# Patient Record
Sex: Female | Born: 1975 | Race: White | Hispanic: No | Marital: Married | Smoking: Never smoker
Health system: Southern US, Community
[De-identification: ages and names within clinical notes are randomized; demographics above are authoritative.]

## PROBLEM LIST (undated history)

## (undated) DIAGNOSIS — J302 Other seasonal allergic rhinitis: Secondary | ICD-10-CM

## (undated) DIAGNOSIS — K429 Umbilical hernia without obstruction or gangrene: Secondary | ICD-10-CM

## (undated) DIAGNOSIS — F329 Major depressive disorder, single episode, unspecified: Secondary | ICD-10-CM

## (undated) DIAGNOSIS — F32A Depression, unspecified: Secondary | ICD-10-CM

## (undated) DIAGNOSIS — N920 Excessive and frequent menstruation with regular cycle: Secondary | ICD-10-CM

## (undated) HISTORY — DX: Umbilical hernia without obstruction or gangrene: K42.9

## (undated) HISTORY — DX: Major depressive disorder, single episode, unspecified: F32.9

## (undated) HISTORY — DX: Other seasonal allergic rhinitis: J30.2

## (undated) HISTORY — DX: Depression, unspecified: F32.A

---

## 2010-04-13 HISTORY — PX: DILATION AND CURETTAGE OF UTERUS: SHX78

## 2013-03-22 DIAGNOSIS — O24419 Gestational diabetes mellitus in pregnancy, unspecified control: Secondary | ICD-10-CM | POA: Insufficient documentation

## 2013-03-22 DIAGNOSIS — F329 Major depressive disorder, single episode, unspecified: Secondary | ICD-10-CM | POA: Insufficient documentation

## 2013-03-22 DIAGNOSIS — O0990 Supervision of high risk pregnancy, unspecified, unspecified trimester: Secondary | ICD-10-CM | POA: Insufficient documentation

## 2013-03-22 DIAGNOSIS — F32A Depression, unspecified: Secondary | ICD-10-CM | POA: Insufficient documentation

## 2013-11-27 DIAGNOSIS — Z8632 Personal history of gestational diabetes: Secondary | ICD-10-CM | POA: Insufficient documentation

## 2013-11-27 DIAGNOSIS — E785 Hyperlipidemia, unspecified: Secondary | ICD-10-CM | POA: Insufficient documentation

## 2014-03-27 ENCOUNTER — Emergency Department: Payer: Self-pay | Admitting: Emergency Medicine

## 2014-03-27 LAB — URINALYSIS, COMPLETE
Bacteria: NONE SEEN
Bilirubin,UR: NEGATIVE
Blood: NEGATIVE
GLUCOSE, UR: NEGATIVE mg/dL (ref 0–75)
KETONE: NEGATIVE
LEUKOCYTE ESTERASE: NEGATIVE
Nitrite: NEGATIVE
Ph: 8 (ref 4.5–8.0)
Protein: NEGATIVE
RBC, UR: NONE SEEN /HPF (ref 0–5)
SQUAMOUS EPITHELIAL: NONE SEEN
Specific Gravity: 1.005 (ref 1.003–1.030)

## 2014-03-27 LAB — CBC WITH DIFFERENTIAL/PLATELET
BASOS ABS: 0.1 10*3/uL (ref 0.0–0.1)
Basophil %: 0.9 %
EOS PCT: 2.8 %
Eosinophil #: 0.2 10*3/uL (ref 0.0–0.7)
HCT: 45.1 % (ref 35.0–47.0)
HGB: 14.6 g/dL (ref 12.0–16.0)
Lymphocyte #: 1.6 10*3/uL (ref 1.0–3.6)
Lymphocyte %: 27.1 %
MCH: 27.6 pg (ref 26.0–34.0)
MCHC: 32.4 g/dL (ref 32.0–36.0)
MCV: 85 fL (ref 80–100)
MONO ABS: 0.4 x10 3/mm (ref 0.2–0.9)
Monocyte %: 7.4 %
NEUTROS ABS: 3.7 10*3/uL (ref 1.4–6.5)
Neutrophil %: 61.8 %
PLATELETS: 334 10*3/uL (ref 150–440)
RBC: 5.29 10*6/uL — AB (ref 3.80–5.20)
RDW: 12.6 % (ref 11.5–14.5)
WBC: 6 10*3/uL (ref 3.6–11.0)

## 2014-03-27 LAB — COMPREHENSIVE METABOLIC PANEL
ALBUMIN: 4.2 g/dL (ref 3.4–5.0)
ALT: 30 U/L
Alkaline Phosphatase: 144 U/L — ABNORMAL HIGH
Anion Gap: 6 — ABNORMAL LOW (ref 7–16)
BILIRUBIN TOTAL: 0.6 mg/dL (ref 0.2–1.0)
BUN: 13 mg/dL (ref 7–18)
CALCIUM: 8.6 mg/dL (ref 8.5–10.1)
CHLORIDE: 104 mmol/L (ref 98–107)
Co2: 28 mmol/L (ref 21–32)
Creatinine: 0.71 mg/dL (ref 0.60–1.30)
GLUCOSE: 96 mg/dL (ref 65–99)
Osmolality: 276 (ref 275–301)
POTASSIUM: 3.5 mmol/L (ref 3.5–5.1)
SGOT(AST): 20 U/L (ref 15–37)
Sodium: 138 mmol/L (ref 136–145)
Total Protein: 7.5 g/dL (ref 6.4–8.2)

## 2014-03-27 LAB — LIPASE, BLOOD: LIPASE: 208 U/L (ref 73–393)

## 2015-05-15 ENCOUNTER — Emergency Department
Admission: EM | Admit: 2015-05-15 | Discharge: 2015-05-15 | Disposition: A | Discharge status: Home / Self Care | Payer: Managed Care, Other (non HMO) | Attending: Emergency Medicine | Admitting: Emergency Medicine

## 2015-05-15 ENCOUNTER — Emergency Department: Payer: Managed Care, Other (non HMO)

## 2015-05-15 ENCOUNTER — Encounter: Payer: Self-pay | Admitting: Emergency Medicine

## 2015-05-15 ENCOUNTER — Other Ambulatory Visit: Payer: Self-pay

## 2015-05-15 DIAGNOSIS — Z3202 Encounter for pregnancy test, result negative: Secondary | ICD-10-CM | POA: Insufficient documentation

## 2015-05-15 DIAGNOSIS — K429 Umbilical hernia without obstruction or gangrene: Secondary | ICD-10-CM | POA: Insufficient documentation

## 2015-05-15 DIAGNOSIS — R1033 Periumbilical pain: Secondary | ICD-10-CM | POA: Diagnosis present

## 2015-05-15 LAB — COMPREHENSIVE METABOLIC PANEL
ALT: 47 U/L (ref 14–54)
AST: 25 U/L (ref 15–41)
Albumin: 4.4 g/dL (ref 3.5–5.0)
Alkaline Phosphatase: 103 U/L (ref 38–126)
Anion gap: 5 (ref 5–15)
BILIRUBIN TOTAL: 0.9 mg/dL (ref 0.3–1.2)
BUN: 10 mg/dL (ref 6–20)
CHLORIDE: 109 mmol/L (ref 101–111)
CO2: 25 mmol/L (ref 22–32)
CREATININE: 0.58 mg/dL (ref 0.44–1.00)
Calcium: 9.3 mg/dL (ref 8.9–10.3)
GFR calc Af Amer: >60 mL/min (ref >60)
GLUCOSE: 91 mg/dL (ref 65–99)
Potassium: 3.6 mmol/L (ref 3.5–5.1)
Sodium: 139 mmol/L (ref 135–145)
Total Protein: 7 g/dL (ref 6.5–8.1)

## 2015-05-15 LAB — URINALYSIS COMPLETE WITH MICROSCOPIC (ARMC ONLY)
BILIRUBIN URINE: NEGATIVE
GLUCOSE, UA: NEGATIVE mg/dL
Hgb urine dipstick: NEGATIVE
KETONES UR: NEGATIVE mg/dL
LEUKOCYTES UA: NEGATIVE
Nitrite: NEGATIVE
PH: 6 (ref 5.0–8.0)
Protein, ur: NEGATIVE mg/dL
SQUAMOUS EPITHELIAL / LPF: NONE SEEN
Specific Gravity, Urine: 1.003 — ABNORMAL LOW (ref 1.005–1.030)
WBC, UA: NONE SEEN WBC/hpf (ref 0–5)

## 2015-05-15 LAB — CBC
HCT: 40.6 % (ref 35.0–47.0)
Hemoglobin: 13.6 g/dL (ref 12.0–16.0)
MCH: 27.8 pg (ref 26.0–34.0)
MCHC: 33.5 g/dL (ref 32.0–36.0)
MCV: 82.9 fL (ref 80.0–100.0)
PLATELETS: 234 10*3/uL (ref 150–440)
RBC: 4.89 MIL/uL (ref 3.80–5.20)
RDW: 12.7 % (ref 11.5–14.5)
WBC: 5.2 10*3/uL (ref 3.6–11.0)

## 2015-05-15 LAB — POCT PREGNANCY, URINE: Preg Test, Ur: NEGATIVE

## 2015-05-15 LAB — LIPASE, BLOOD: Lipase: 41 U/L (ref 11–51)

## 2015-05-15 MED ORDER — SODIUM CHLORIDE 0.9 % IV BOLUS (SEPSIS)
1000.0000 mL | Freq: Once | INTRAVENOUS | Status: AC
  Administered 2015-05-15: 1000 mL via INTRAVENOUS

## 2015-05-15 MED ORDER — IOHEXOL 240 MG/ML SOLN
25.0000 mL | Freq: Once | INTRAMUSCULAR | Status: AC | PRN
  Administered 2015-05-15: 25 mL via ORAL
  Filled 2015-05-15: qty 25

## 2015-05-15 MED ORDER — IBUPROFEN 800 MG PO TABS: 800.0000 mg | ORAL_TABLET | Freq: Three times a day (TID) | ORAL | Status: AC | PRN

## 2015-05-15 MED ORDER — IOHEXOL 300 MG/ML  SOLN
100.0000 mL | Freq: Once | INTRAMUSCULAR | Status: AC | PRN
  Administered 2015-05-15: 100 mL via INTRAVENOUS
  Filled 2015-05-15: qty 100

## 2015-05-15 MED ORDER — ONDANSETRON HCL 4 MG/2ML IJ SOLN
4.0000 mg | Freq: Once | INTRAMUSCULAR | Status: AC
  Administered 2015-05-15: 4 mg via INTRAVENOUS
  Filled 2015-05-15: qty 2

## 2015-05-15 MED ORDER — OXYCODONE-ACETAMINOPHEN 5-325 MG PO TABS: 1.0000 | ORAL_TABLET | Freq: Four times a day (QID) | ORAL | Status: DC | PRN

## 2015-05-15 MED ORDER — HYDROMORPHONE HCL 1 MG/ML IJ SOLN
0.5000 mg | Freq: Once | INTRAMUSCULAR | Status: DC
  Filled 2015-05-15: qty 1

## 2015-05-15 NOTE — ED Notes (Signed)
Pt to ed with c/o abd pain in and around umbilicus.  Pt states areas around umbilicus is tender to palpation.  Pt reports any pressure on abd causes severe pain and nausea.

## 2015-05-15 NOTE — Discharge Instructions (Signed)
Please make a follow-up appointment for evaluation for surgical intervention for your hernia. Return to the emergency department if you develop severe pain, inability to keep down fluids, fever, or any other symptoms concerning to you. You may take Tylenol or Motrin for mild to moderate pain and Percocet for severe pain. Do not drive within 8 hours of taking Percocet.  Hernia, Adult A hernia is the bulging of an organ or tissue through a weak spot in the muscles of the abdomen (abdominal wall). Hernias develop most often near the navel or groin. There are many kinds of hernias. Common kinds include:  Femoral hernia. This kind of hernia develops under the groin in the upper thigh area.  Inguinal hernia. This kind of hernia develops in the groin or scrotum.  Umbilical hernia. This kind of hernia develops near the navel.  Hiatal hernia. This kind of hernia causes part of the stomach to be pushed up into the chest.  Incisional hernia. This kind of hernia bulges through a scar from an abdominal surgery. CAUSES This condition may be caused by:  Heavy lifting.  Coughing over a long period of time.  Straining to have a bowel movement.  An incision made during an abdominal surgery.  A birth defect (congenital defect).  Excess weight or obesity.  Smoking.  Poor nutrition.  Cystic fibrosis.  Excess fluid in the abdomen.  Undescended testicles. SYMPTOMS Symptoms of a hernia include:  A lump on the abdomen. This is the first sign of a hernia. The lump may become more obvious with standing, straining, or coughing. It may get bigger over time if it is not treated or if the condition causing it is not treated.  Pain. A hernia is usually painless, but it may become painful over time if treatment is delayed. The pain is usually dull and may get worse with standing or lifting heavy objects. Sometimes a hernia gets tightly squeezed in the weak spot (strangulated) or stuck there  (incarcerated) and causes additional symptoms. These symptoms may include:  Vomiting.  Nausea.  Constipation.  Irritability. DIAGNOSIS A hernia may be diagnosed with:  A physical exam. During the exam your health care provider may ask you to cough or to make a specific movement, because a hernia is usually more visible when you move.  Imaging tests. These can include:  X-rays.  Ultrasound.  CT scan. TREATMENT A hernia that is small and painless may not need to be treated. A hernia that is large or painful may be treated with surgery. Inguinal hernias may be treated with surgery to prevent incarceration or strangulation. Strangulated hernias are always treated with surgery, because lack of blood to the trapped organ or tissue can cause it to die. Surgery to treat a hernia involves pushing the bulge back into place and repairing the weak part of the abdomen. HOME CARE INSTRUCTIONS  Avoid straining.  Do not lift anything heavier than 10 lb (4.5 kg).  Lift with your leg muscles, not your back muscles. This helps avoid strain.  When coughing, try to cough gently.  Prevent constipation. Constipation leads to straining with bowel movements, which can make a hernia worse or cause a hernia repair to break down. You can prevent constipation by:  Eating a high-fiber diet that includes plenty of fruits and vegetables.  Drinking enough fluids to keep your urine clear or pale yellow. Aim to drink 6-8 glasses of water per day.  Using a stool softener as directed by your health care provider.  Lose weight, if you are overweight.  Do not use any tobacco products, including cigarettes, chewing tobacco, or electronic cigarettes. If you need help quitting, ask your health care provider.  Keep all follow-up visits as directed by your health care provider. This is important. Your health care provider may need to monitor your condition. SEEK MEDICAL CARE IF:  You have swelling, redness, and  pain in the affected area.  Your bowel habits change. SEEK IMMEDIATE MEDICAL CARE IF:  You have a fever.  You have abdominal pain that is getting worse.  You feel nauseous or you vomit.  You cannot push the hernia back in place by gently pressing on it while you are lying down.  The hernia:  Changes in shape or size.  Is stuck outside the abdomen.  Becomes discolored.  Feels hard or tender.   This information is not intended to replace advice given to you by your health care provider. Make sure you discuss any questions you have with your health care provider.   Document Released: 03/30/2005 Document Revised: 04/20/2014 Document Reviewed: 02/07/2014 Elsevier Interactive Patient Education [employer]

## 2015-05-15 NOTE — ED Notes (Signed)
NAD noted at time of D/C. Pt denies questions or concerns. Pt ambulatory to the lobby at this time.  

## 2015-05-15 NOTE — ED Provider Notes (Signed)
[facility] Emergency Department Provider Note  ____________________________________________  Time seen: Approximately 4:18 PM  I have reviewed the triage vital signs and the nursing notes.   HISTORY  Chief Complaint Abdominal Pain    HPI Breanna Berg is a 40 y.o. female with a history of umbilical hernia for at least 2 years presenting with pain at the umbilical hernia site that started today. Patient states that she woke up and was having tenderness to palpation over her umbilical hernia with some slight bluish discoloration. She also reports abdominal distention. Positive nausea without vomiting, no diarrhea, no urinary symptoms, no change in vaginal discharge. Nothing makes the pain better or worse. No fever or chills.   History reviewed. No pertinent past medical history.  There are no active problems to display for this patient.   History reviewed. No pertinent past surgical history.  No current outpatient prescriptions on file.  Allergies Avelox and Wellbutrin  History reviewed. No pertinent family history.  Social History Social History  Substance Use Topics  . Smoking status: Never Smoker   . Smokeless tobacco: None  . Alcohol Use: Yes    Review of Systems Constitutional: No fever/chills. No lightheadedness or syncope. Eyes: No visual changes. ENT: No sore throat. Cardiovascular: Denies chest pain, palpitations. Respiratory: Denies shortness of breath.  No cough. Gastrointestinal: Positive umbilical abdominal pain with palpable mass.  Positive nausea, no vomiting.  No diarrhea.  No constipation. Genitourinary: Negative for dysuria. No change in vaginal discharge Musculoskeletal: Negative for back pain. Skin: Negative for rash. Neurological: Negative for headaches, focal weakness or numbness.  10-point ROS otherwise negative.  ____________________________________________   PHYSICAL EXAM:  VITAL SIGNS: ED Triage Vitals  Enc  Vitals Group     BP 05/15/15 1408 119/69 mmHg     Pulse Rate 05/15/15 1408 75     Resp 05/15/15 1408 20     Temp 05/15/15 1408 98.1 F (36.7 C)     Temp Source 05/15/15 1408 Oral     SpO2 05/15/15 1408 100 %     Weight 05/15/15 1408 150 lb (68.04 kg)     Height 05/15/15 1408 5\' 2"  (1.575 m)     Head Cir --      Peak Flow --      Pain Score 05/15/15 1409 6     Pain Loc --      Pain Edu? --      Excl. in GC? --     Constitutional: Alert and oriented. Well appearing and in no acute distress. Answer question appropriately. Eyes: Conjunctivae are normal.  EOMI. no scleral icterus. Head: Atraumatic. Nose: No congestion/rhinnorhea. Mouth/Throat: Mucous membranes are moist.  Neck: No stridor.  Supple.   Cardiovascular: Normal rate, regular rhythm. No murmurs, rubs or gallops.  Respiratory: Normal respiratory effort.  No retractions. Lungs CTAB.  No wheezes, rales or ronchi. Gastrointestinal: Abdomen is overweight, soft and mildly distended. The patient has a palpable 2 x 2 cm mass at the superior aspect of the umbilicus that increases in pain with deep palpation. No rebound or guarding. No peritoneal signs.  Musculoskeletal: No LE edema.  Neurologic:  Normal speech and language. No gross focal neurologic deficits are appreciated.  Skin:  Skin is warm, dry and intact. No rash noted. Psychiatric: Mood and affect are normal. Speech and behavior are normal.  Normal judgement.  ____________________________________________   LABS (all labs ordered are listed, but only abnormal results are displayed)  Labs Reviewed  URINALYSIS COMPLETEWITH MICROSCOPIC [facility]  ONLY) - Abnormal; Notable for the following:    Color, Urine COLORLESS (*)    APPearance CLEAR (*)    Specific Gravity, Urine 1.003 (*)    Bacteria, UA RARE (*)    All other components within normal limits  LIPASE, BLOOD  COMPREHENSIVE METABOLIC PANEL  CBC  POC URINE PREG, ED  POCT PREGNANCY, URINE  POC URINE PREG, ED    ____________________________________________  EKG  ED ECG REPORT I, Rockne Menghini, the attending physician, personally viewed and interpreted this ECG.   Date: 05/15/2015  EKG Time: 1548  Rate: 60  Rhythm: normal sinus rhythm  Axis: Normal  Intervals:none  ST&T Change: Nonspecific T-wave inversion in V1. No ST elevation. No ischemic changes.  ____________________________________________  RADIOLOGY  Ct Abdomen Pelvis W Contrast  05/15/2015  CLINICAL DATA:  39 year old female complaining of pain around the umbilicus. Periumbilical region is reportedly tender to palpation. Pressure on abdomen causes severe pain and nausea. EXAM: CT ABDOMEN AND PELVIS WITH CONTRAST TECHNIQUE: Multidetector CT imaging of the abdomen and pelvis was performed using the standard protocol following bolus administration of intravenous contrast. CONTRAST:  OMNIPAQUE IOHEXOL 300 MG/ML  SOLN COMPARISON:  No priors. FINDINGS: Lower chest:  Unremarkable. Hepatobiliary: No cystic or solid hepatic lesions. No intra or extrahepatic biliary ductal dilatation. Gallbladder is normal in appearance. Pancreas: No pancreatic mass. No pancreatic ductal dilatation. No pancreatic or peripancreatic fluid or inflammatory changes. Spleen: Unremarkable. Adrenals/Urinary Tract: Bilateral adrenal glands and bilateral kidneys are normal in appearance. No hydroureteronephrosis or perinephric stranding to indicate urinary tract obstruction at this time. Urinary bladder is normal in appearance. Stomach/Bowel: Normal appearance of the stomach. No pathologic dilatation of small bowel or colon. Normal appendix. Vascular/Lymphatic: No significant atherosclerotic disease, aneurysm or dissection identified in the abdominal or pelvic vasculature. No lymphadenopathy noted in the abdomen or pelvis. Reproductive: IUD present in the uterus. Ovaries are unremarkable in appearance. Other: Small umbilical hernia containing only omental fat. No  significant volume of ascites. No pneumoperitoneum. Musculoskeletal: There are no aggressive appearing lytic or blastic lesions noted in the visualized portions of the skeleton. IMPRESSION: 1. Small umbilical hernia containing only omental fat. No associated bowel incarceration or obstruction at this time. 2. Normal appendix. 3. Additional incidental findings, as above. Electronically Signed   By: Trudie Reed M.D.   On: 05/15/2015 17:55    ____________________________________________   PROCEDURES  Procedure(s) performed: None  Critical Care performed: No ____________________________________________   INITIAL IMPRESSION / ASSESSMENT AND PLAN / ED COURSE  Pertinent labs & imaging results that were available during my care of the patient were reviewed by me and considered in my medical decision making (see chart for details).  40 y.o. female with a long history of umbilical hernia presenting with acute pain around the umbilicus. Associated nausea but no vomiting. Patient's labs are reassuring however her physical examination demonstrates tenderness around an area that likely has an underlying hernia. I will get a CT scan to evaluate for incarceration. Plan symptomatic treatment and reevaluate after her diagnostic testing is completed.  ----------------------------------------- 7:04 PM on 05/15/2015 -----------------------------------------  The patient does have an umbilical hernia with omental fat but no intestine and no evidence for incarceration on her CT. I will plan to discharge her home for evaluation by general surgery for elective repair of her hernia. She understands return precautions, as well as discharge instructions and follow-up instructions.  ____________________________________________  FINAL CLINICAL IMPRESSION(S) / ED DIAGNOSES  Final diagnoses:  Umbilical hernia without obstruction and  without gangrene      NEW MEDICATIONS STARTED DURING THIS VISIT:  New  Prescriptions   No medications on file     Rockne Menghini, MD 05/15/15 1905

## 2015-05-21 ENCOUNTER — Encounter: Payer: Self-pay | Admitting: Surgery

## 2015-05-22 ENCOUNTER — Encounter: Payer: Self-pay | Admitting: General Surgery

## 2015-05-22 ENCOUNTER — Ambulatory Visit (INDEPENDENT_AMBULATORY_CARE_PROVIDER_SITE_OTHER): Payer: Managed Care, Other (non HMO) | Admitting: General Surgery

## 2015-05-22 VITALS — BP 108/75 | HR 87 | Temp 97.9°F | Ht 62.0 in | Wt 154.0 lb

## 2015-05-22 DIAGNOSIS — K429 Umbilical hernia without obstruction or gangrene: Secondary | ICD-10-CM | POA: Diagnosis not present

## 2015-05-22 NOTE — Progress Notes (Signed)
Patient ID: Breanna Berg, female   DOB: [birth year], 40 y.o.   MRN: [id number]  CC: UMBILICAL HERNIA  HPI Breanna Berg is a 40 y.o. female  Presents to clinic today for evaluation of umbilical hernia. Patient thinks it might even present since birth of her child 2 years ago. However, it only became symptomatic over the last couple of weeks after having a GI bug with persistent nausea and vomiting. Last week she had an episode of abdominal pain at the site of her hernia, abdominal distention, nausea and vomiting approximately to the emergency department. While the emergency department showed a CT scan which showed umbilical hernia that was fat containing and her symptoms resolved. Since that time she is continued to have intermittent pain prompting her to seek follow-up with surgery for surgical resolution. She denies any current fevers, chills, nausea, vomiting, chest pain, shortness breath, diarrhea, constipation. She is otherwise in her usual state of health.  HPI  Past Medical History  Diagnosis Date  . Hernia, umbilical   . Seasonal allergies     Past Surgical History  Procedure Laterality Date  . Dilation and curettage of uterus  04/2010    Family History  Problem Relation Age of Onset  . Diabetes Father     Social History Social History  Substance Use Topics  . Smoking status: Never Smoker   . Smokeless tobacco: None  . Alcohol Use: 0.0 oz/week    0 Standard drinks or equivalent per week     Comment: ocass    Allergies  Allergen Reactions  . Avelox [Moxifloxacin] Nausea And Vomiting  . Wellbutrin [Bupropion] Rash    Current Outpatient Prescriptions  Medication Sig Dispense Refill  . FLUoxetine (PROZAC) 20 MG capsule Take 1 capsule by mouth 1 day or 1 dose.    . fluticasone (FLONASE) 50 MCG/ACT nasal spray Place 2 sprays into the nose 1 day or 1 dose.    . ibuprofen (ADVIL,MOTRIN) 800 MG tablet Take 1 tablet (800 mg total) by mouth every 8 (eight) hours as needed. 20  tablet 0  . loratadine (CLARITIN) 10 MG tablet Take 1 tablet by mouth as needed.    . montelukast (SINGULAIR) 10 MG tablet Take 1 tablet by mouth 1 day or 1 dose.     No current facility-administered medications for this visit.     Review of Systems A  Multi-point review of systems was asked and was negative except for the findings documented in the history of present illness  Physical Exam Blood pressure 108/75, pulse 87, temperature 97.9 F (36.6 C), temperature source Oral, height  (1.575 m), weight 69.854 kg (154 lb), last menstrual period 05/11/2015. CONSTITUTIONAL:  No acute distress. EYES: Pupils are equal, round, and reactive to light, Sclera are non-icteric. EARS, NOSE, MOUTH AND THROAT: The oropharynx is clear. The oral mucosa is pink and moist. Hearing is intact to voice. LYMPH NODES:  Lymph nodes in the neck are normal. RESPIRATORY:  Lungs are clear. There is normal respiratory effort, with equal breath sounds bilaterally, and without pathologic use of accessory muscles. CARDIOVASCULAR: Heart is regular without murmurs, gallops, or rubs. GI: The abdomen is soft,  With minimal tenderness at the site of her easily reducible umbilical hernia, and nondistended. There are no palpable masses. There is no hepatosplenomegaly. There are normal bowel sounds in all quadrants. GU: Rectal deferred.   MUSCULOSKELETAL: Normal muscle strength and tone. No cyanosis or edema.   SKIN: Turgor is good and there are no pathologic  skin lesions or ulcers. NEUROLOGIC: Motor and sensation is grossly normal. Cranial nerves are grossly intact. PSYCH:  Oriented to person, place and time. Affect is normal.  Data Reviewed  her CT scan from last week was personally reviewed. It does show approximately 1 cm fascial defect that is containing omentum. There is no evidence of bowel involvement in her hernia on her images. I have personally reviewed the patient's imaging, laboratory findings and medical  records.    Assessment     40 year old female with asymptomatic umbilical hernia     Plan     otherwise healthy 40 year old female with an umbilical hernia. Discussed treatment options to include open versus laparoscopic hernia repairs. The procedures of both were discussed in detail to include the risks, benefits, alternatives. Patient voiced understanding and would like to proceed with an open umbilical hernia repair. Since the hernia is able to be reduced with ease discussed that this could be put off to a convenient time for her. Her mother-in-law is moving to town next week and she desires to wait until the end of the month for childcare reasons if possible. Discussed with patient the signs and symptoms of incarceration or strangulation and to return to the emergency department immediately should they occur otherwise we will plan for elective open repair on February 27 the surgery center.      Time spent with the patient was 45 minutes, with more than 50% of the time spent in face-to-face education, counseling and care coordination.     [name], MD FACS General Surgeon 05/22/2015, 4:19 PM

## 2015-05-22 NOTE — Patient Instructions (Addendum)
We are able to do your surgery on 06/10/2015. This will be at [facility] and they will be contacting you with pre-op discussion and surgery arrival time. Address: [address], [place], [place] [address]  [phone].

## 2015-05-23 ENCOUNTER — Telehealth: Payer: Self-pay | Admitting: General Surgery

## 2015-05-23 NOTE — Telephone Encounter (Signed)
I have called the patient to advise her of pre op date/time and sx date. No answer. I have left a message.  Sx: 06/10/15 with Dr Tonita Cong [facility]--Laparoscopic umbilical hernia repair. Pre op: [facility] will call patient prior to surgery to do a phone pre op. Also [facility] will also call patient the day before to advise the patient of the time to arrive for surgery.

## 2015-05-24 ENCOUNTER — Ambulatory Visit: Payer: Self-pay | Admitting: Surgery

## 2015-05-24 NOTE — Telephone Encounter (Signed)
Patient has called back and was informed of all surgery information. °

## 2015-05-30 ENCOUNTER — Telehealth: Payer: Self-pay | Admitting: General Surgery

## 2015-05-30 NOTE — Telephone Encounter (Signed)
Called patient to let her know what's going on. I told her that we were able to schedule surgery on 06/27/2015 with Dr. Elesa Massed and Dr. Tonita Cong. Patient stated that she will be out of town that weekend. She was asking if she could have surgery scheduled the first week of March. I told her that Dr. Elesa Massed was not able to until March 16 th. She then stated that she would call Dr. Elesa Massed personally (cell phone since they are friends) and ask when she could do her surgery. Patient stated that she will call me to let me know when Dr. Elesa Massed would be able to do her surgery so we could correlate with Dr. Tonita Cong. I told her that it would be fine.

## 2015-05-30 NOTE — Telephone Encounter (Signed)
Dr. Elesa Massed called and wanted to know when we were able to do patient's surgery. She stated that patient wants her surgery to be done on March 2nd and she is okay if another surgeon does her hernia repair. I told Dr. Elesa Massed that I would have to ask Dr. Tonita Cong tomorrow morning so he could ask Dr. Everlene Farrier if he could do this surgery for him. Once I get an answer from Dr. Tonita Cong I would call Harriett Sine to let her know that March 2nd would be a great day. Dr. Elesa Massed agreed.

## 2015-05-30 NOTE — Telephone Encounter (Signed)
Patient is scheduled for hernia surgery with [facility] on 2/27. Patient called today to advise that she would like to have hernia surgery and tubal ligation surgery done at the same time. Patient states she went to [employer] and saw [facility] and Ms Ward agreed to this. Patient states Dr Tonita Cong needs to speak with Ms Ward to discuss a date that will work for both of them to perform both surgeries at the same time.  Patient wanted you to know she is currently taking Amoxicillin for a sinus infection in case that has any effect on scheduling her surgery.  Patient states if it is going to be a long time before she can have these 2 surgeries done together, then she would like to keep her February 27th and just have her hernia surgery and have the tubal done at a later time  . Please call at your convenience.

## 2015-05-30 NOTE — Telephone Encounter (Signed)
Called [facility] and spoke with Harriett Sine (Referral Coordinator). The reason I called them was to ask when we could schedule surgery for patient. Harriett Sine told me that she was able to schedule patient's surgery on 06/27/2015 at [facility] with Dr. Elesa Massed. I told Harriett Sine that I would tell Dr. Tonita Cong when Dr. Elesa Massed was able to do surgery with him. I told Harriett Sine that I would call patient to notify what is going on right now.

## 2015-06-03 NOTE — Telephone Encounter (Signed)
After reviewing patient's situation with Dr. Tonita Cong, he stated that he would do her surgery on June 13, 2015-first case. I then told Dr. Elesa Massed, Harriett Sine and [facility] and everybody is on aboard to proceed surgery on 06/13/2015.

## 2015-06-05 ENCOUNTER — Telehealth: Payer: Self-pay

## 2015-06-05 ENCOUNTER — Encounter
Admission: RE | Admit: 2015-06-05 | Discharge: 2015-06-05 | Disposition: A | Discharge status: Home / Self Care | Payer: Managed Care, Other (non HMO) | Source: Ambulatory Visit | Attending: General Surgery | Admitting: General Surgery

## 2015-06-05 DIAGNOSIS — Z01812 Encounter for preprocedural laboratory examination: Secondary | ICD-10-CM | POA: Diagnosis not present

## 2015-06-05 HISTORY — DX: Excessive and frequent menstruation with regular cycle: N92.0

## 2015-06-05 LAB — CBC
HEMATOCRIT: 41.3 % (ref 35.0–47.0)
HEMOGLOBIN: 13.9 g/dL (ref 12.0–16.0)
MCH: 28.1 pg (ref 26.0–34.0)
MCHC: 33.6 g/dL (ref 32.0–36.0)
MCV: 83.6 fL (ref 80.0–100.0)
Platelets: 255 10*3/uL (ref 150–440)
RBC: 4.94 MIL/uL (ref 3.80–5.20)
RDW: 13.1 % (ref 11.5–14.5)
WBC: 5.6 10*3/uL (ref 3.6–11.0)

## 2015-06-05 LAB — BASIC METABOLIC PANEL
ANION GAP: 8 (ref 5–15)
BUN: 10 mg/dL (ref 6–20)
CALCIUM: 9.1 mg/dL (ref 8.9–10.3)
CHLORIDE: 107 mmol/L (ref 101–111)
CO2: 28 mmol/L (ref 22–32)
Creatinine, Ser: 0.71 mg/dL (ref 0.44–1.00)
GFR calc non Af Amer: >60 mL/min (ref >60)
Glucose, Bld: 128 mg/dL — ABNORMAL HIGH (ref 65–99)
Potassium: 3.1 mmol/L — ABNORMAL LOW (ref 3.5–5.1)
SODIUM: 143 mmol/L (ref 135–145)

## 2015-06-05 LAB — TYPE AND SCREEN
ABO/RH(D): O POS
ANTIBODY SCREEN: NEGATIVE

## 2015-06-05 MED ORDER — POTASSIUM CHLORIDE CRYS ER 20 MEQ PO TBCR: 20.0000 meq | EXTENDED_RELEASE_TABLET | Freq: Two times a day (BID) | ORAL | Status: AC

## 2015-06-05 NOTE — Telephone Encounter (Signed)
Notified by Pre-admit that Pre-op Potassium is 3.1. Patient will be placed on K-Dur BID x 5 days prior to surgery. Patient will take this 06/08/15-06/12/15. Medication has been sent to pharmacy at this time.

## 2015-06-05 NOTE — Patient Instructions (Signed)
  Your procedure is scheduled on: 06/13/15 Thurs Report to Day Surgery.2nd floor medical mall To find out your arrival time please call [phone] between 1PM - 3PM on 06/12/15 Wed  Remember: Instructions that are not followed completely may result in serious medical risk, up to and including death, or upon the discretion of your surgeon and anesthesiologist your surgery may need to be rescheduled.    __x__ 1. Do not eat food or drink liquids after midnight. No gum chewing or hard candies.     __x__ 2. No Alcohol for 24 hours before or after surgery.   ____ 3. Bring all medications with you on the day of surgery if instructed.    __x__ 4. Notify your doctor if there is any change in your medical condition     (cold, fever, infections).     Do not wear jewelry, make-up, hairpins, clips or nail polish.  Do not wear lotions, powders, or perfumes. You may wear deodorant.  Do not shave 48 hours prior to surgery. Men may shave face and neck.  Do not bring valuables to the hospital.    [facility] is not responsible for any belongings or valuables.               Contacts, dentures or bridgework may not be worn into surgery.  Leave your suitcase in the car. After surgery it may be brought to your room.  For patients admitted to the hospital, discharge time is determined by your                treatment team.   Patients discharged the day of surgery will not be allowed to drive home.   Please read over the following fact sheets that you were given:      _x___ Take these medicines the morning of surgery with A SIP OF WATER:    1. FLUoxetine (PROZAC) 20 MG capsule  2. oxyCODONE-acetaminophen (PERCOCET/ROXICET) 5-325 MG tablet  3.   4.  5.  6.  ____ Fleet Enema (as directed)   _x___ Use CHG Soap as directed  ____ Use inhalers on the day of surgery  ____ Stop metformin 2 days prior to surgery    ____ Take 1/2 of usual insulin dose the night before surgery and none on the morning of  surgery.   ____ Stop Coumadin/Plavix/aspirin on   __x__ Stop Anti-inflammatories on Stop ibuprofen tomorrow 06/06/15   ____ Stop supplements until after surgery.    ____ Bring C-Pap to the hospital.

## 2015-06-05 NOTE — Pre-Procedure Instructions (Signed)
Potassium 3.1 called and faxed Dr Tonita Cong with lab results and request for potassium supplement.

## 2015-06-05 NOTE — Telephone Encounter (Signed)
Called patient to let her know that her Potassium level was low and therefore she will need to start taking Potassium five days prior to her surgery. I told patient that her prescription was sent to [facility]. Patient understood and had no further questions.

## 2015-06-06 LAB — ABO/RH: ABO/RH(D): O POS

## 2015-06-10 ENCOUNTER — Encounter: Admission: RE | Payer: Self-pay | Source: Ambulatory Visit

## 2015-06-10 ENCOUNTER — Ambulatory Visit
Admission: RE | Admit: 2015-06-10 | Payer: Managed Care, Other (non HMO) | Source: Ambulatory Visit | Admitting: General Surgery

## 2015-06-10 SURGERY — REPAIR, HERNIA, UMBILICAL, ADULT
Anesthesia: General

## 2015-06-13 ENCOUNTER — Encounter
Admission: RE | Disposition: A | Discharge status: Home / Self Care | Payer: Self-pay | Source: Ambulatory Visit | Attending: General Surgery

## 2015-06-13 ENCOUNTER — Encounter: Payer: Self-pay | Admitting: *Deleted

## 2015-06-13 ENCOUNTER — Ambulatory Visit: Payer: Managed Care, Other (non HMO) | Admitting: Anesthesiology

## 2015-06-13 ENCOUNTER — Ambulatory Visit
Admission: RE | Admit: 2015-06-13 | Discharge: 2015-06-13 | Disposition: A | Discharge status: Home / Self Care | Payer: Managed Care, Other (non HMO) | Source: Ambulatory Visit | Attending: General Surgery | Admitting: General Surgery

## 2015-06-13 DIAGNOSIS — F329 Major depressive disorder, single episode, unspecified: Secondary | ICD-10-CM | POA: Insufficient documentation

## 2015-06-13 DIAGNOSIS — K429 Umbilical hernia without obstruction or gangrene: Secondary | ICD-10-CM | POA: Diagnosis not present

## 2015-06-13 DIAGNOSIS — Z825 Family history of asthma and other chronic lower respiratory diseases: Secondary | ICD-10-CM | POA: Insufficient documentation

## 2015-06-13 DIAGNOSIS — R140 Abdominal distension (gaseous): Secondary | ICD-10-CM | POA: Insufficient documentation

## 2015-06-13 DIAGNOSIS — N920 Excessive and frequent menstruation with regular cycle: Secondary | ICD-10-CM | POA: Diagnosis not present

## 2015-06-13 DIAGNOSIS — Z91048 Other nonmedicinal substance allergy status: Secondary | ICD-10-CM | POA: Insufficient documentation

## 2015-06-13 DIAGNOSIS — Z79899 Other long term (current) drug therapy: Secondary | ICD-10-CM | POA: Diagnosis not present

## 2015-06-13 DIAGNOSIS — R109 Unspecified abdominal pain: Secondary | ICD-10-CM | POA: Insufficient documentation

## 2015-06-13 DIAGNOSIS — Z8042 Family history of malignant neoplasm of prostate: Secondary | ICD-10-CM | POA: Diagnosis not present

## 2015-06-13 DIAGNOSIS — Z833 Family history of diabetes mellitus: Secondary | ICD-10-CM | POA: Insufficient documentation

## 2015-06-13 DIAGNOSIS — Z888 Allergy status to other drugs, medicaments and biological substances status: Secondary | ICD-10-CM | POA: Insufficient documentation

## 2015-06-13 DIAGNOSIS — Z302 Encounter for sterilization: Secondary | ICD-10-CM | POA: Insufficient documentation

## 2015-06-13 HISTORY — PX: LAPAROSCOPIC BILATERAL SALPINGECTOMY: SHX5889

## 2015-06-13 HISTORY — PX: LAPAROSCOPIC TUBAL LIGATION: SHX1937

## 2015-06-13 HISTORY — PX: NOVASURE ABLATION: SHX5394

## 2015-06-13 HISTORY — PX: UMBILICAL HERNIA REPAIR: SHX196

## 2015-06-13 LAB — POCT PREGNANCY, URINE: Preg Test, Ur: NEGATIVE

## 2015-06-13 LAB — POCT I-STAT 4, (NA,K, GLUC, HGB,HCT): Potassium: 3.5 mmol/L

## 2015-06-13 SURGERY — REPAIR, HERNIA, UMBILICAL, LAPAROSCOPIC
Anesthesia: General

## 2015-06-13 MED ORDER — FENTANYL CITRATE (PF) 100 MCG/2ML IJ SOLN
INTRAMUSCULAR | Status: AC
  Filled 2015-06-13: qty 2

## 2015-06-13 MED ORDER — LIDOCAINE HCL (PF) 1 % IJ SOLN
INTRAMUSCULAR | Status: AC
  Filled 2015-06-13: qty 30

## 2015-06-13 MED ORDER — ROCURONIUM BROMIDE 100 MG/10ML IV SOLN
INTRAVENOUS | Status: DC | PRN
  Administered 2015-06-13 (×2): 10 mg via INTRAVENOUS
  Administered 2015-06-13: 40 mg via INTRAVENOUS

## 2015-06-13 MED ORDER — ONDANSETRON HCL 4 MG/2ML IJ SOLN
4.0000 mg | Freq: Once | INTRAMUSCULAR | Status: AC | PRN
  Administered 2015-06-13: 4 mg via INTRAVENOUS

## 2015-06-13 MED ORDER — FENTANYL CITRATE (PF) 100 MCG/2ML IJ SOLN
25.0000 ug | INTRAMUSCULAR | Status: AC | PRN
  Administered 2015-06-13 (×6): 25 ug via INTRAVENOUS

## 2015-06-13 MED ORDER — CEFAZOLIN SODIUM-DEXTROSE 2-3 GM-% IV SOLR
2.0000 g | INTRAVENOUS | Status: AC
  Administered 2015-06-13: 2 g via INTRAVENOUS

## 2015-06-13 MED ORDER — SODIUM CHLORIDE 0.9 % IV SOLN
INTRAVENOUS | Status: DC | PRN
  Administered 2015-06-13: 35 mL

## 2015-06-13 MED ORDER — FENTANYL CITRATE (PF) 100 MCG/2ML IJ SOLN
INTRAMUSCULAR | Status: DC | PRN
  Administered 2015-06-13 (×2): 100 ug via INTRAVENOUS
  Administered 2015-06-13: 50 ug via INTRAVENOUS

## 2015-06-13 MED ORDER — BUPIVACAINE HCL (PF) 0.5 % IJ SOLN
INTRAMUSCULAR | Status: AC
  Filled 2015-06-13: qty 60

## 2015-06-13 MED ORDER — LACTATED RINGERS IV SOLN
INTRAVENOUS | Status: DC
  Administered 2015-06-13: 07:00:00 via INTRAVENOUS

## 2015-06-13 MED ORDER — FAMOTIDINE 20 MG PO TABS
20.0000 mg | ORAL_TABLET | Freq: Once | ORAL | Status: AC
  Administered 2015-06-13: 20 mg via ORAL

## 2015-06-13 MED ORDER — EPHEDRINE SULFATE 50 MG/ML IJ SOLN
INTRAMUSCULAR | Status: DC | PRN
  Administered 2015-06-13: 10 mg via INTRAVENOUS

## 2015-06-13 MED ORDER — SODIUM CHLORIDE 0.9 % IJ SOLN
INTRAMUSCULAR | Status: AC
  Filled 2015-06-13: qty 50

## 2015-06-13 MED ORDER — PROPOFOL 10 MG/ML IV BOLUS
INTRAVENOUS | Status: DC | PRN
  Administered 2015-06-13: 130 mg via INTRAVENOUS

## 2015-06-13 MED ORDER — DEXAMETHASONE SODIUM PHOSPHATE 10 MG/ML IJ SOLN
INTRAMUSCULAR | Status: DC | PRN
  Administered 2015-06-13: 10 mg via INTRAVENOUS

## 2015-06-13 MED ORDER — MIDAZOLAM HCL 2 MG/2ML IJ SOLN
INTRAMUSCULAR | Status: DC | PRN
  Administered 2015-06-13: 2 mg via INTRAVENOUS

## 2015-06-13 MED ORDER — ONDANSETRON 4 MG PO TBDP: 4.0000 mg | ORAL_TABLET | Freq: Three times a day (TID) | ORAL | Status: DC | PRN

## 2015-06-13 MED ORDER — BUPIVACAINE LIPOSOME 1.3 % IJ SUSP
INTRAMUSCULAR | Status: AC
  Filled 2015-06-13: qty 20

## 2015-06-13 MED ORDER — CHLORHEXIDINE GLUCONATE 4 % EX LIQD: 1.0000 "application " | Freq: Once | CUTANEOUS | Status: DC

## 2015-06-13 MED ORDER — BUPIVACAINE HCL (PF) 0.5 % IJ SOLN
INTRAMUSCULAR | Status: AC
  Filled 2015-06-13: qty 30

## 2015-06-13 MED ORDER — ONDANSETRON HCL 4 MG/2ML IJ SOLN
INTRAMUSCULAR | Status: DC | PRN
  Administered 2015-06-13: 4 mg via INTRAVENOUS

## 2015-06-13 MED ORDER — PHENYLEPHRINE HCL 10 MG/ML IJ SOLN
INTRAMUSCULAR | Status: DC | PRN
  Administered 2015-06-13 (×3): 100 ug via INTRAVENOUS

## 2015-06-13 MED ORDER — OXYCODONE-ACETAMINOPHEN 5-325 MG PO TABS: 1.0000 | ORAL_TABLET | ORAL | Status: DC | PRN

## 2015-06-13 MED ORDER — SUGAMMADEX SODIUM 200 MG/2ML IV SOLN
INTRAVENOUS | Status: DC | PRN
  Administered 2015-06-13: 136 mg via INTRAVENOUS

## 2015-06-13 MED ORDER — CEFAZOLIN SODIUM-DEXTROSE 2-3 GM-% IV SOLR
INTRAVENOUS | Status: AC
  Administered 2015-06-13: 2 g via INTRAVENOUS
  Filled 2015-06-13: qty 50

## 2015-06-13 MED ORDER — FAMOTIDINE 20 MG PO TABS
ORAL_TABLET | ORAL | Status: AC
  Administered 2015-06-13: 20 mg via ORAL
  Filled 2015-06-13: qty 1

## 2015-06-13 MED ORDER — ONDANSETRON HCL 4 MG/2ML IJ SOLN
INTRAMUSCULAR | Status: AC
  Filled 2015-06-13: qty 2

## 2015-06-13 MED ORDER — LIDOCAINE HCL 1 % IJ SOLN
INTRAMUSCULAR | Status: DC | PRN
  Administered 2015-06-13: 5 mL

## 2015-06-13 SURGICAL SUPPLY — 56 items
BAG URO DRAIN 2000ML W/SPOUT (MISCELLANEOUS) ×4 IMPLANT
BLADE SURG SZ11 CARB STEEL (BLADE) ×4 IMPLANT
CANISTER SUCT 1200ML W/VALVE (MISCELLANEOUS) ×4 IMPLANT
CANISTER SUCT 3000ML (MISCELLANEOUS) ×4 IMPLANT
CATH FOLEY 2WAY  5CC 16FR (CATHETERS) ×2
CATH ROBINSON RED A/P 16FR (CATHETERS) ×4 IMPLANT
CATH URTH 16FR FL 2W BLN LF (CATHETERS) ×2 IMPLANT
CHLORAPREP W/TINT 26ML (MISCELLANEOUS) ×4 IMPLANT
COTTON BALL STRL MEDIUM (GAUZE/BANDAGES/DRESSINGS) ×4 IMPLANT
DRAPE LEGGINS SURG 28X43 STRL (DRAPES) ×4 IMPLANT
DRAPE UNDER BUTTOCK W/FLU (DRAPES) ×4 IMPLANT
DRESSING TELFA 4X3 1S ST N-ADH (GAUZE/BANDAGES/DRESSINGS) ×12 IMPLANT
DRSG TEGADERM 2-3/8X2-3/4 SM (GAUZE/BANDAGES/DRESSINGS) ×8 IMPLANT
DRSG TEGADERM 4X4.75 (GAUZE/BANDAGES/DRESSINGS) ×4 IMPLANT
ENDOPOUCH RETRIEVER 10 (MISCELLANEOUS) ×4 IMPLANT
GAUZE SPONGE NON-WVN 2X2 STRL (MISCELLANEOUS) ×4 IMPLANT
GLOVE BIOGEL PI IND STRL 6.5 (GLOVE) ×2 IMPLANT
GLOVE BIOGEL PI INDICATOR 6.5 (GLOVE) ×2
GLOVE INDICATOR 8.0 STRL GRN (GLOVE) ×4 IMPLANT
GLOVE SURG SYN 6.5 ES PF (GLOVE) ×4 IMPLANT
GOWN STRL REUS W/ TWL LRG LVL3 (GOWN DISPOSABLE) ×4 IMPLANT
GOWN STRL REUS W/ TWL XL LVL3 (GOWN DISPOSABLE) ×2 IMPLANT
GOWN STRL REUS W/TWL LRG LVL3 (GOWN DISPOSABLE) ×4
GOWN STRL REUS W/TWL XL LVL3 (GOWN DISPOSABLE) ×2
GRASPER SUT TROCAR 14GX15 (MISCELLANEOUS) ×4 IMPLANT
IRRIGATION STRYKERFLOW (MISCELLANEOUS) IMPLANT
IRRIGATOR STRYKERFLOW (MISCELLANEOUS)
IV LACTATED RINGERS 1000ML (IV SOLUTION) ×4 IMPLANT
KIT RM TURNOVER CYSTO AR (KITS) ×4 IMPLANT
LABEL OR SOLS (LABEL) ×4 IMPLANT
LIGASURE MARYLAND LAP STAND (ELECTROSURGICAL) ×4 IMPLANT
LIQUID BAND (GAUZE/BANDAGES/DRESSINGS) ×4 IMPLANT
MANIPULATOR UTERINE ZUMI 4.5 (MISCELLANEOUS) ×4 IMPLANT
NEEDLE VERESS 14GA 120MM (NEEDLE) ×4 IMPLANT
NOVASURE ENDOMETRIAL ABLATION (MISCELLANEOUS) ×4 IMPLANT
NS IRRIG 500ML POUR BTL (IV SOLUTION) ×4 IMPLANT
PACK DNC HYST (MISCELLANEOUS) ×4 IMPLANT
PACK GYN LAPAROSCOPIC (MISCELLANEOUS) ×4 IMPLANT
PACK LAP CHOLECYSTECTOMY (MISCELLANEOUS) ×4 IMPLANT
PAD OB MATERNITY 4.3X12.25 (PERSONAL CARE ITEMS) ×4 IMPLANT
PAD PREP 24X41 OB/GYN DISP (PERSONAL CARE ITEMS) ×8 IMPLANT
PAD TRENDELENBURG OR TABLE (MISCELLANEOUS) ×4 IMPLANT
SCISSORS METZENBAUM CVD 33 (INSTRUMENTS) ×4 IMPLANT
SHEARS HARMONIC ACE PLUS 36CM (ENDOMECHANICALS) ×4 IMPLANT
SLEEVE ENDOPATH XCEL 5M (ENDOMECHANICALS) ×8 IMPLANT
SPONGE VERSALON 2X2 STRL (MISCELLANEOUS) ×4
STRAP SAFETY BODY (MISCELLANEOUS) ×4 IMPLANT
SUT MNCRL AB 4-0 PS2 18 (SUTURE) ×4 IMPLANT
SUT VIC AB 2-0 UR6 27 (SUTURE) ×4 IMPLANT
SUT VIC AB 4-0 PS2 18 (SUTURE) ×4 IMPLANT
SYRINGE 10CC LL (SYRINGE) ×4 IMPLANT
TROCAR ENDO BLADELESS 11MM (ENDOMECHANICALS) ×4 IMPLANT
TROCAR XCEL NON-BLD 5MMX100MML (ENDOMECHANICALS) ×4 IMPLANT
TUBING CONNECTING 10 (TUBING) ×3 IMPLANT
TUBING CONNECTING 10' (TUBING) ×1
TUBING INSUFFLATOR HI FLOW (MISCELLANEOUS) ×4 IMPLANT

## 2015-06-13 NOTE — Interval H&P Note (Signed)
History and Physical Interval Note:  06/13/2015 7:08 AM  Breanna Berg  has presented today for surgery, with the diagnosis of umbilical hernia  The various methods of treatment have been discussed with the patient and family. After consideration of risks, benefits and other options for treatment, the patient has consented to  Procedure(s): LAPAROSCOPIC UMBILICAL HERNIA (N/A) LAPAROSCOPIC TUBAL LIGATION (N/A) LAPAROSCOPIC BILATERAL SALPINGECTOMY (Bilateral) NOVASURE ABLATION (N/A) as a surgical intervention .  The patient's history has been reviewed, patient examined, no change in status, stable for surgery.  I have reviewed the patient's chart and labs.  Questions were answered to the patient's satisfaction.     [name]

## 2015-06-13 NOTE — Discharge Instructions (Signed)
Umbilical Herniorrhaphy, Care After Refer to this sheet in the next few weeks. These instructions provide you with information on caring for yourself after your procedure. Your health care provider may also give you more specific instructions. Your treatment has been planned according to current medical practices, but problems sometimes occur. Call your health care provider if you have any problems or questions after your procedure. HOME CARE INSTRUCTIONS  If you are given antibiotic medicine, take it as directed. Finish it even if you start to feel better.  Only take over-the-counter or prescription medicines for pain, fever, or discomfort as directed by your health care provider. Do not take aspirin. It can cause bleeding.  Do not get your surgical cut (incision) area wet unless your health care provider says it is okay. (OK After 48 hours)  Avoid lifting objects heavier than 10 lb (4.5 kg) for 6-8 weeks after surgery.  Avoid sexual activity for 5 weeks after surgery or as directed by your health care provider.  Do not drive while taking prescription pain medicine.  You may return to your other normal, daily activities after 3 days or as directed by your health care provider. SEEK MEDICAL CARE IF:  You notice blood or fluid leaking from the surgical site.  Your incision area becomes red or swollen.  Your pain at the surgical site becomes worse or is not relieved by medicine.  You have problems urinating.  You feel nauseous or vomit more than 2 days after surgery.  You notice the bulge in your abdomen returns after the procedure. SEEK IMMEDIATE MEDICAL CARE IF:  You have a fever.  You have nausea or vomiting that will not stop.   This information is not intended to replace advice given to you by your health care provider. Make sure you discuss any questions you have with your health care provider.   Document Released: 09/29/2011 Document Revised: 04/20/2014 Document Reviewed:  09/29/2011 Elsevier Interactive Patient Education 2016 [employer].    Discharge instructions:  Call office if you have any of the following: fever >101 F, chills, excessive vaginal bleeding, incision drainage or problems, leg pain or redness, or any other concerns. You will have a discharge for about 3-4 weeks.  Your first period, when or if you have one, may be heavier than normal.    Activity: Do not lift > 10 lbs for 6 weeks.  No intercourse or tampons for4 weeks.  No driving for 1-2 weeks.   Call your doctor for increased pain or vaginal bleeding, temperature above 101.0, or concerns. No strenuous activity or heavy lifting for 6 weeks. No intercourse, tampons, douching, or enemas for 6 weeks. No tub baths-showers only. No driving for 2 weeks or while taking pain medications.  Don't be a hero but don't be a wimp either!! :-) AMBULATORY SURGERY  DISCHARGE INSTRUCTIONS   1) The drugs that you were given will stay in your system until tomorrow so for the next 24 hours you should not:  A) Drive an automobile B) Make any legal decisions C) Drink any alcoholic beverage  2) You may resume regular meals tomorrow.  Today it is better to start with liquids and gradually work up to solid foods.  You may eat anything you prefer, but it is better to start with liquids, then soup and crackers, and gradually work up to solid foods.   3) Please notify your doctor immediately if you have any unusual bleeding, trouble breathing, redness and pain at the surgery site,  drainage, fever, or pain not relieved by medication.   4) Additional Instructions:   Please contact your physician with any problems or Same Day Surgery at [phone], Monday through Friday 6 am to 4 pm, or [place] at [facility] number at [phone].

## 2015-06-13 NOTE — Anesthesia Preprocedure Evaluation (Signed)
Anesthesia Evaluation  Patient identified by MRN, date of birth, ID band Patient awake    Reviewed: Allergy & Precautions, NPO status , Patient's Chart, lab work & pertinent test results, reviewed documented beta blocker date and time   Airway Mallampati: II  TM Distance: >3 FB     Dental  (+) Chipped   Pulmonary           Cardiovascular      Neuro/Psych PSYCHIATRIC DISORDERS Depression    GI/Hepatic   Endo/Other  diabetes, Well Controlled, Type 2  Renal/GU      Musculoskeletal   Abdominal   Peds  Hematology   Anesthesia Other Findings   Reproductive/Obstetrics                             Anesthesia Physical Anesthesia Plan  ASA: II  Anesthesia Plan: General   Post-op Pain Management:    Induction: Intravenous  Airway Management Planned: Oral ETT  Additional Equipment:   Intra-op Plan:   Post-operative Plan:   Informed Consent: I have reviewed the patients History and Physical, chart, labs and discussed the procedure including the risks, benefits and alternatives for the proposed anesthesia with the patient or authorized representative who has indicated his/her understanding and acceptance.     Plan Discussed with: CRNA  Anesthesia Plan Comments:         Anesthesia Quick Evaluation

## 2015-06-13 NOTE — Op Note (Addendum)
Breanna Berg PROCEDURE DATE: 06/13/2015  PATIENT:  Breanna Berg  40 y.o. female  PRE-OPERATIVE DIAGNOSIS:  umbilical hernia,desires permanent sterilization and IUD removal  POST-OPERATIVE DIAGNOSIS:  same  PROCEDURE:  Procedure(s): LAPAROSCOPIC UMBILICAL HERNIA (N/A) LAPAROSCOPIC TUBAL LIGATION (N/A) LAPAROSCOPIC BILATERAL SALPINGECTOMY (Bilateral) NOVASURE ABLATION (N/A)  SURGEON:  Surgeon(s) and Role: Panel 1:    * [name], MD - Primary  Panel 2:    * Elenora Fender Dayelin Balducci, MD - Primary  ANESTHESIA:  General via ET  I/O  Total I/O In: 1000 [I.V.:1000] Out: 110 [Urine:100; Blood:10]  FINDINGS:  Small mobile uterus, normal cervix with strings not visible, normal ovaries and fallopian tubes bilaterally.  Normal upper abdomen.  Hernia findings per surgery op note.  SPECIMEN: bilateral fallopian tubes from cornua to fimbriated ends.  COMPLICATIONS: none apparent  DISPOSITION: vital signs stable to PACU   Indication for Surgery: 40 y.o. [id number] with menorrhagia and dissatisfied with IUD side effects and desired permanent sterilization.    Risks of surgery were discussed with the patient including but not limited to: bleeding which may require transfusion or reoperation; infection which may require antibiotics; injury to bowel, bladder, ureters or other surrounding organs; need for additional procedures including laparotomy, blood clot, incisional problems and other postoperative/anesthesia complications. Permanent sterility will be a result of this procedure. Written informed consent was obtained.      PROCEDURE IN DETAIL:  The patient had sequential compression devices applied to her lower extremities while in the preoperative area.  She was then taken to the operating room where general anesthesia was administered and was found to be adequate.  She was placed in the dorsal lithotomy position, and was prepped and draped in a sterile manner.  A Foley catheter was inserted  into her bladder and attached to constant drainage and a smooth pickup was inserted into the cervix and the IUD was removed.  A Zumi uterine manipulator was then advanced into the uterus .  After a surgical timeout was performed, attention was turned to the abdomen where an umbilical incision was made with the scalpel and umbilical hernia was attended to by Dr. Tonita Cong with the bovie.  When sufficient exposure/reduction was achieved, a 5mm umbilical defect was encountered and through this the 5mm trochar was inserted. The abdomen was insufflated to Hg carbon dioxide gas and adequate pneumoperitoneum was obtained. A 5mm trochar was inserted in the midline suprapubic area under visualization.    The Ligasure was used to seal and divide the tubes bilaterally along the mesosalpinx from the cornua to the fimbriated ends.  Additional cautery was needed in the right cornua for minimal bleeding.  Both tubes were removed intact through the 5mm trochar and the pelvis was irrigated, and hemostasis was confirmed.  The trochars were removed after the pneumoperitoneum was deflated. Dr. Tonita Cong resumed his umbilical hernia repair.  Please see his op note.  The attention was turned to the vagina, where the uterine manipulator was removed and hysteroscope was inserted.  Uterine length was 8cm, Cervical length and cavity length were both 4cm.  The Novasure device was inserted and width was confirmed at 4.0cm.  The vacuum was confirmed and the device was employed for 1:42 at 88 power.  The device was removed and hysteroscope reinserted and confirmed a good char diffusely in the cavity.  The instruments were removed.  Hemostasis was noted at the tenaculum site.  Dr. Guinevere Ferrari concluded his portion of the case at the same time.  All instruments, needles, and sponge counts were correct x 2. The patient was taken to the recovery room in stable condition.   ---- Ranae Plumber, MD Attending Obstetrician and  Gynecologist [facility]

## 2015-06-13 NOTE — Anesthesia Procedure Notes (Signed)
Procedure Name: Intubation Date/Time: 06/13/2015 7:40 AM Performed by: Omer Jack Pre-anesthesia Checklist: Patient identified, Patient being monitored, Timeout performed, Emergency Drugs available and Suction available Patient Re-evaluated:Patient Re-evaluated prior to inductionOxygen Delivery Method: Circle system utilized Preoxygenation: Pre-oxygenation with 100% oxygen Intubation Type: IV induction Ventilation: Mask ventilation without difficulty Laryngoscope Size: Mac and 3 Grade View: Grade I Tube type: Oral Tube size: 7.0 mm Number of attempts: 1 Placement Confirmation: ETT inserted through vocal cords under direct vision,  positive ETCO2 and breath sounds checked- equal and bilateral Secured at: 21 cm Tube secured with: Tape Dental Injury: Teeth and Oropharynx as per pre-operative assessment

## 2015-06-13 NOTE — Transfer of Care (Signed)
Immediate Anesthesia Transfer of Care Note  Patient: FAYETTA SORENSON  Procedure(s) Performed: Procedure(s): LAPAROSCOPIC UMBILICAL HERNIA (N/A) LAPAROSCOPIC TUBAL LIGATION (N/A) LAPAROSCOPIC BILATERAL SALPINGECTOMY (Bilateral) NOVASURE ABLATION (N/A)  Patient Location: PACU  Anesthesia Type:General  Level of Consciousness: awake, alert  and oriented  Airway & Oxygen Therapy: Patient Spontanous Breathing and Patient connected to face mask oxygen  Post-op Assessment: Report given to RN and Post -op Vital signs reviewed and stable  Post vital signs: Reviewed and stable  Last Vitals:  Filed Vitals:   06/13/15 0918 06/13/15 0920  BP: 126/65   Pulse: 111 108  Temp: 36 C   Resp: 14 16    Complications: No apparent anesthesia complications

## 2015-06-13 NOTE — H&P (Signed)
H&P Update  PLEASE SEE PAPER H&P  Pt was last seen in my office, and complete history and physical performed.  The surgical history has been reviewed and remains accurate without interval change. The patient was re-examined and patient's physiologic condition has not changed significantly in the last 30 days.  No new pharmacological allergies or types of therapy has been initiated.  Allergies  Allergen Reactions  . Avelox [Moxifloxacin] Nausea And Vomiting  . Mesh Pants [Attends Briefs Large] Rash  . Wellbutrin [Bupropion] Rash    Past Medical History  Diagnosis Date  . Hernia, umbilical   . Seasonal allergies   . Depression   . Menorrhagia    Past Surgical History  Procedure Laterality Date  . Dilation and curettage of uterus  04/2010    BP 108/69 mmHg  Pulse 88  Temp(Src) 98.4 F (36.9 C) (Oral)  Resp 16  Ht  (1.575 m)  Wt 68.04 kg (150 lb)  BMI 27.43 kg/m2  SpO2 98%  LMP 05/11/2015  NAD RRR no murmurs CTAB, no wheezing, resps unlabored +BS, soft, NTTP No c/c/e Pelvic exam deferred  The above history was confirmed with the patient. The condition still exists that makes this procedure necessary. Surgical plan includes Laparoscopic Bilateral Salpingectomy and Endometrial Ablation (with removal of IUD) as confirmed on the consent. The treatment plan remains the same, without new options for care.  The patient understands the potential benefits and risks and the consents have been signed and placed on the chart.     Ranae Plumber, MD Attending Obstetrician Gynecologist [facility]

## 2015-06-13 NOTE — H&P (View-Only) (Signed)
Patient ID: Breanna Berg, female   DOB: 1975-12-25, 40 y.o.   MRN: [id number]  CC: UMBILICAL HERNIA  HPI Breanna Berg is a 40 y.o. female  Presents to clinic today for evaluation of umbilical hernia. Patient thinks it might even present since birth of her child 2 years ago. However, it only became symptomatic over the last couple of weeks after having a GI bug with persistent nausea and vomiting. Last week she had an episode of abdominal pain at the site of her hernia, abdominal distention, nausea and vomiting approximately to the emergency department. While the emergency department showed a CT scan which showed umbilical hernia that was fat containing and her symptoms resolved. Since that time she is continued to have intermittent pain prompting her to seek follow-up with surgery for surgical resolution. She denies any current fevers, chills, nausea, vomiting, chest pain, shortness breath, diarrhea, constipation. She is otherwise in her usual state of health.  HPI  Past Medical History  Diagnosis Date  . Hernia, umbilical   . Seasonal allergies     Past Surgical History  Procedure Laterality Date  . Dilation and curettage of uterus  04/2010    Family History  Problem Relation Age of Onset  . Diabetes Father     Social History Social History  Substance Use Topics  . Smoking status: Never Smoker   . Smokeless tobacco: None  . Alcohol Use: 0.0 oz/week    0 Standard drinks or equivalent per week     Comment: ocass    Allergies  Allergen Reactions  . Avelox [Moxifloxacin] Nausea And Vomiting  . Wellbutrin [Bupropion] Rash    Current Outpatient Prescriptions  Medication Sig Dispense Refill  . FLUoxetine (PROZAC) 20 MG capsule Take 1 capsule by mouth 1 day or 1 dose.    . fluticasone (FLONASE) 50 MCG/ACT nasal spray Place 2 sprays into the nose 1 day or 1 dose.    . ibuprofen (ADVIL,MOTRIN) 800 MG tablet Take 1 tablet (800 mg total) by mouth every 8 (eight) hours as needed. 20  tablet 0  . loratadine (CLARITIN) 10 MG tablet Take 1 tablet by mouth as needed.    . montelukast (SINGULAIR) 10 MG tablet Take 1 tablet by mouth 1 day or 1 dose.     No current facility-administered medications for this visit.     Review of Systems A  Multi-point review of systems was asked and was negative except for the findings documented in the history of present illness  Physical Exam Blood pressure 108/75, pulse 87, temperature 97.9 F (36.6 C), temperature source Oral, height  (1.575 m), weight 69.854 kg (154 lb), last menstrual period 05/11/2015. CONSTITUTIONAL:  No acute distress. EYES: Pupils are equal, round, and reactive to light, Sclera are non-icteric. EARS, NOSE, MOUTH AND THROAT: The oropharynx is clear. The oral mucosa is pink and moist. Hearing is intact to voice. LYMPH NODES:  Lymph nodes in the neck are normal. RESPIRATORY:  Lungs are clear. There is normal respiratory effort, with equal breath sounds bilaterally, and without pathologic use of accessory muscles. CARDIOVASCULAR: Heart is regular without murmurs, gallops, or rubs. GI: The abdomen is soft,  With minimal tenderness at the site of her easily reducible umbilical hernia, and nondistended. There are no palpable masses. There is no hepatosplenomegaly. There are normal bowel sounds in all quadrants. GU: Rectal deferred.   MUSCULOSKELETAL: Normal muscle strength and tone. No cyanosis or edema.   SKIN: Turgor is good and there are no pathologic  skin lesions or ulcers. NEUROLOGIC: Motor and sensation is grossly normal. Cranial nerves are grossly intact. PSYCH:  Oriented to person, place and time. Affect is normal.  Data Reviewed  her CT scan from last week was personally reviewed. It does show approximately 1 cm fascial defect that is containing omentum. There is no evidence of bowel involvement in her hernia on her images. I have personally reviewed the patient's imaging, laboratory findings and medical  records.    Assessment     40 year old female with asymptomatic umbilical hernia     Plan     otherwise healthy 40 year old female with an umbilical hernia. Discussed treatment options to include open versus laparoscopic hernia repairs. The procedures of both were discussed in detail to include the risks, benefits, alternatives. Patient voiced understanding and would like to proceed with an open umbilical hernia repair. Since the hernia is able to be reduced with ease discussed that this could be put off to a convenient time for her. Her mother-in-law is moving to town next week and she desires to wait until the end of the month for childcare reasons if possible. Discussed with patient the signs and symptoms of incarceration or strangulation and to return to the emergency department immediately should they occur otherwise we will plan for elective open repair on February 27 the surgery center.      Time spent with the patient was 45 minutes, with more than 50% of the time spent in face-to-face education, counseling and care coordination.     [name], MD FACS General Surgeon 05/22/2015, 4:19 PM

## 2015-06-13 NOTE — Op Note (Signed)
Pre-operative Diagnosis: Umbilical hernia  Post-operative Diagnosis: 5 mm umbilical hernia  Surgeon: [name]   Assistants: Dr. Elesa Massed  Anesthesia: General endotracheal anesthesia  ASA Class: 1  Surgeon: [name], MD FACS  Anesthesia: Gen. with endotracheal tube  Assistant: Same  Procedure Details  The patient was seen again in the Holding Room. The benefits, complications, treatment options, and expected outcomes were discussed with the patient. The risks of bleeding, infection, recurrence of symptoms, failure to resolve symptoms,  bowel injury, any of which could require further surgery were reviewed with the patient.   The patient was taken to Operating Room, identified as Breanna Berg and the procedure verified.  A Time Out was held and the above information confirmed.  Prior to the induction of general anesthesia, antibiotic prophylaxis was administered. VTE prophylaxis was in place. General endotracheal anesthesia was then administered and tolerated well. After the induction, the abdomen was prepped with Chloraprep and draped in the sterile fashion. The patient was positioned in the supine position.  Surgery procedure for an open umbilical hernia repair in the same setting as a bilateral tubal ligation and NovaSure application by OB/GYN. Those procedures will be dictated in a separate operative note.  The umbilical site was identified. The previously identified hernia was completely reduced. Entire area was localized with 1% lidocaine solution. An infra umbilical incision was made with a 15 blade scalpel taken down to the level of the anterior rectus fascia using accommodation of Bovie much cautery and blunt dissection. The umbilical stalk was then bluntly freed up in a circumdental fashion with a Kelly forceps. Once it was circumferentially freed it was dissected free from its attachments to the fascia with a letter cautery. This revealed a 4-5 mm fascial defect.  And a 5 mm trocar was placed through the fascial defect for the completion of the pelvic procedure for OB/GYN.  Once OB/GYN was completed the pneumoperitoneum was released and the fascia was freed off circumferentially above the left cautery. Due to the small size of the hernia defect the decision made to close this primarily with a 0 Ethibond suture. 2 figure-of-eight sutures result was needed to completely reapproximate the fascia. The fascia was investigated in all directions to ensure there were no additional fascial defects. Once this was confirmed the entire area was irrigated with normal saline. Meticulous hemostasis was insured. The entire operative field including an additional operative trocar was localized with Exparel that had been expanded to 40 mL.  The umbilicus was then reattached the fascia with an interrupted 2-0 Vicryl suture. The deep dermal tissue was reapproximated with interrupted 4-0 Vicryl. The skin was closed with a subcuticular running 4-0 Monocryl suture at the umbilicus. The additional low midline 5 trocar was also closed with a subcuticular 4-0 Monocryl. Both operative sites were then closed with LiquiBand. Once the ligament was closed sterile cotton ball was placed in the umbilicus was held in place with a Tegaderm. The negative pressure dressing was created in the umbilicus with the local needle.  The patient tolerated the procedure well. She was returned to the supine position where she was awoken from general tracheal anesthesia. There were no immediate complications. All counts were correct at the procedure.  Findings: 5 mm umbilical hernia   Estimated Blood Loss: 10 mL         Drains: None         Specimens: Per OB/GYN, bilateral tubes          Complications: None  Condition: Good   Clayburn Pert, MD, FACS

## 2015-06-13 NOTE — OR Nursing (Signed)
Patient c/o nausea on arrival. IV Zofran given.

## 2015-06-13 NOTE — Anesthesia Postprocedure Evaluation (Signed)
Anesthesia Post Note  Patient: Breanna Berg  Procedure(s) Performed: Procedure(s) (LRB): LAPAROSCOPIC UMBILICAL HERNIA (N/A) LAPAROSCOPIC TUBAL LIGATION (N/A) LAPAROSCOPIC BILATERAL SALPINGECTOMY (Bilateral) NOVASURE ABLATION (N/A)  Patient location during evaluation: PACU Anesthesia Type: General Level of consciousness: awake and alert Pain management: pain level controlled Vital Signs Assessment: post-procedure vital signs reviewed and stable Respiratory status: spontaneous breathing, nonlabored ventilation, respiratory function stable and patient connected to nasal cannula oxygen Cardiovascular status: blood pressure returned to baseline and stable Postop Assessment: no signs of nausea or vomiting Anesthetic complications: no    Last Vitals:  Filed Vitals:   06/13/15 1146 06/13/15 1225  BP: 109/63 105/63  Pulse: 113 112  Temp:    Resp: 12 12    Last Pain:  Filed Vitals:   06/13/15 1226  PainSc: 2                  Chaseton Yepiz S

## 2015-06-13 NOTE — Brief Op Note (Signed)
06/13/2015  9:18 AM  PATIENT:  Breanna Berg  40 y.o. female  PRE-OPERATIVE DIAGNOSIS:  umbilical hernia,desires permanent sterilization and IUD removal  POST-OPERATIVE DIAGNOSIS:  same  PROCEDURE:  Procedure(s): Open UMBILICAL HERNIA (N/A) LAPAROSCOPIC TUBAL LIGATION (N/A) LAPAROSCOPIC BILATERAL SALPINGECTOMY (Bilateral) NOVASURE ABLATION (N/A)  SURGEON:  Surgeon(s) and Role: Panel 1:    * [name], MD - Primary  Panel 2:    * Elenora Fender Ward, MD - Primary  PHYSICIAN ASSISTANT:   ASSISTANTS: none   ANESTHESIA:   general  EBL:  Total I/O In: 1000 [I.V.:1000] Out: 110 [Urine:100; Blood:10]  BLOOD ADMINISTERED:none  DRAINS: none   LOCAL MEDICATIONS USED:  XYLOCAINE , Amount: 5 ml and OTHER Exparel 35ml  SPECIMEN:  Source of Specimen:  bilateral tubes  DISPOSITION OF SPECIMEN:  PATHOLOGY  COUNTS:  YES  TOURNIQUET:  * No tourniquets in log *  DICTATION: .Dragon Dictation  PLAN OF CARE: Discharge to home after PACU  PATIENT DISPOSITION:  PACU - hemodynamically stable.   Delay start of Pharmacological VTE agent (>24hrs) due to surgical blood loss or risk of bleeding: not applicable

## 2015-06-14 LAB — SURGICAL PATHOLOGY

## 2015-07-02 ENCOUNTER — Other Ambulatory Visit: Payer: Self-pay

## 2015-07-03 ENCOUNTER — Encounter: Payer: Self-pay | Admitting: General Surgery

## 2015-07-03 ENCOUNTER — Ambulatory Visit (INDEPENDENT_AMBULATORY_CARE_PROVIDER_SITE_OTHER): Payer: Managed Care, Other (non HMO) | Admitting: General Surgery

## 2015-07-03 ENCOUNTER — Encounter (INDEPENDENT_AMBULATORY_CARE_PROVIDER_SITE_OTHER): Payer: Self-pay

## 2015-07-03 VITALS — BP 109/76 | HR 90 | Temp 98.1°F | Wt 156.0 lb

## 2015-07-03 DIAGNOSIS — Z4889 Encounter for other specified surgical aftercare: Secondary | ICD-10-CM

## 2015-07-03 NOTE — Patient Instructions (Addendum)
Just so you know, you will have bloating for two more months.  Please call our office with any questions or concerns.  Please do not submerge in a tub, hot tub, or pool until incisions are completely sealed.  Use sun block to incision area over the next year if this area will be exposed to sun. This helps decrease scarring.  You may now resume your normal activities. However, do not lift anything more than 15 lbs for four more weeks. If you have pain when lifting, stop and then try again in a few days.  If you develop redness, drainage, or pain at incision sites- call our office immediately and speak with a nurse.

## 2015-07-03 NOTE — Progress Notes (Signed)
Outpatient Surgical Follow Up  07/03/2015  Breanna Berg is an 40 y.o. female.   Chief Complaint  Patient presents with  . Routine Post Op    Laparoscopic Umbilical Hernia Repair Dr. Tonita CongWoodham     HPI: Patient returns for follow-up from an open umbilical hernia repair. She denies any fevers, chills, nausea, vomiting, diarrhea, constipation, abdominal pain. She does have intermittent bloating sensation and feels like she is holding onto water. However this is waxing and waning. Otherwise been very happy with her surgical experience.  Past Medical History  Diagnosis Date  . Hernia, umbilical   . Seasonal allergies   . Depression   . Menorrhagia     Past Surgical History  Procedure Laterality Date  . Dilation and curettage of uterus  04/2010  . Umbilical hernia repair N/A 06/13/2015    Procedure: LAPAROSCOPIC UMBILICAL HERNIA;  Surgeon: Ricarda Frameharles Anelia Carriveau, MD;  Location: [facility];  Service: General;  Laterality: N/A;  . Laparoscopic tubal ligation N/A 06/13/2015    Procedure: LAPAROSCOPIC TUBAL LIGATION;  Surgeon: Elenora Fenderhelsea C Ward, MD;  Location: [facility];  Service: Gynecology;  Laterality: N/A;  . Laparoscopic bilateral salpingectomy Bilateral 06/13/2015    Procedure: LAPAROSCOPIC BILATERAL SALPINGECTOMY;  Surgeon: Elenora Fenderhelsea C Ward, MD;  Location: [facility];  Service: Gynecology;  Laterality: Bilateral;  . Novasure ablation N/A 06/13/2015    Procedure: NOVASURE ABLATION;  Surgeon: Elenora Fenderhelsea C Ward, MD;  Location: [facility];  Service: Gynecology;  Laterality: N/A;    Family History  Problem Relation Age of Onset  . Diabetes Father     Social History:  reports that she has never smoked. She does not have any smokeless tobacco history on file. She reports that she drinks alcohol. She reports that she does not use illicit drugs.  Allergies:  Allergies  Allergen Reactions  . Avelox [Moxifloxacin] Nausea And Vomiting  . Mesh Pants [Attends Briefs Large] Rash  . Wellbutrin [Bupropion] Rash     Medications reviewed.    ROS A multipoint review of systems was completed. All pertinent positives and negatives are documented within the history of present illness and the remainder are negative.   BP 109/76 mmHg  Pulse 90  Temp(Src) 98.1 F (36.7 C) (Oral)  Wt 70.761 kg (156 lb)  LMP 06/05/2015  Physical Exam Gen.: No acute distress  chest: Clear to auscultation Heart: Regular rate and rhythm Abdomen soft, nontender, nondistended. Well approximated and healing infraumbilical and lower midline incisions. No evidence of erythema or drainage.    No results found for this or any previous visit (from the past 48 hour(s)). No results found.  Assessment/Plan:  1. Aftercare following surgery 40 year old female 2 weeks status post open umbilical hernia repair in the setting of a tubal ligation. Doing well. Discussed that a bloating sensation is normal for up to 6-8 weeks after surgery. Discussed the signs and symptoms of infection or recurrence and for her to return to clinic should they occur. Otherwise she can follow up on an as-needed basis.     Ricarda Frameharles Lonzell Dorris, MD FACS General Surgeon  07/03/2015,11:10 AM

## 2016-02-20 ENCOUNTER — Other Ambulatory Visit: Payer: Self-pay | Admitting: Obstetrics & Gynecology

## 2016-02-20 DIAGNOSIS — Z1231 Encounter for screening mammogram for malignant neoplasm of breast: Secondary | ICD-10-CM

## 2016-03-31 ENCOUNTER — Ambulatory Visit
Admission: RE | Admit: 2016-03-31 | Discharge: 2016-03-31 | Disposition: A | Discharge status: Home / Self Care | Payer: Managed Care, Other (non HMO) | Source: Ambulatory Visit | Attending: Obstetrics & Gynecology | Admitting: Obstetrics & Gynecology

## 2016-03-31 ENCOUNTER — Encounter: Payer: Self-pay | Admitting: Radiology

## 2016-03-31 DIAGNOSIS — Z1231 Encounter for screening mammogram for malignant neoplasm of breast: Secondary | ICD-10-CM | POA: Diagnosis not present

## 2016-07-08 ENCOUNTER — Emergency Department: Payer: Managed Care, Other (non HMO)

## 2016-07-08 ENCOUNTER — Emergency Department
Admission: EM | Admit: 2016-07-08 | Discharge: 2016-07-08 | Disposition: A | Discharge status: Home / Self Care | Payer: Managed Care, Other (non HMO) | Attending: Emergency Medicine | Admitting: Emergency Medicine

## 2016-07-08 DIAGNOSIS — S4991XA Unspecified injury of right shoulder and upper arm, initial encounter: Secondary | ICD-10-CM | POA: Diagnosis present

## 2016-07-08 DIAGNOSIS — Y939 Activity, unspecified: Secondary | ICD-10-CM | POA: Insufficient documentation

## 2016-07-08 DIAGNOSIS — W109XXA Fall (on) (from) unspecified stairs and steps, initial encounter: Secondary | ICD-10-CM | POA: Diagnosis not present

## 2016-07-08 DIAGNOSIS — Y929 Unspecified place or not applicable: Secondary | ICD-10-CM | POA: Diagnosis not present

## 2016-07-08 DIAGNOSIS — S43004A Unspecified dislocation of right shoulder joint, initial encounter: Secondary | ICD-10-CM

## 2016-07-08 DIAGNOSIS — S43014A Anterior dislocation of right humerus, initial encounter: Secondary | ICD-10-CM | POA: Insufficient documentation

## 2016-07-08 DIAGNOSIS — Y999 Unspecified external cause status: Secondary | ICD-10-CM | POA: Diagnosis not present

## 2016-07-08 MED ORDER — OXYCODONE-ACETAMINOPHEN 5-325 MG PO TABS: 1.0000 | ORAL_TABLET | Freq: Four times a day (QID) | ORAL | 0 refills | Status: AC | PRN

## 2016-07-08 MED ORDER — ONDANSETRON HCL 4 MG/2ML IJ SOLN
4.0000 mg | Freq: Once | INTRAMUSCULAR | Status: AC
  Administered 2016-07-08: 4 mg via INTRAVENOUS

## 2016-07-08 MED ORDER — HYDROMORPHONE HCL 1 MG/ML IJ SOLN
0.5000 mg | Freq: Once | INTRAMUSCULAR | Status: AC
  Administered 2016-07-08: 0.5 mg via INTRAVENOUS
  Filled 2016-07-08: qty 1

## 2016-07-08 MED ORDER — FENTANYL CITRATE (PF) 100 MCG/2ML IJ SOLN
INTRAMUSCULAR | Status: AC
  Administered 2016-07-08: 50 ug via INTRAVENOUS
  Filled 2016-07-08: qty 2

## 2016-07-08 MED ORDER — FENTANYL CITRATE (PF) 100 MCG/2ML IJ SOLN
50.0000 ug | INTRAMUSCULAR | Status: DC | PRN
Start: 2016-07-08 — End: 2016-07-08

## 2016-07-08 MED ORDER — HYDROMORPHONE HCL 1 MG/ML IJ SOLN
1.0000 mg | Freq: Once | INTRAMUSCULAR | Status: AC
  Administered 2016-07-08: 1 mg via INTRAVENOUS
  Filled 2016-07-08: qty 1

## 2016-07-08 MED ORDER — ONDANSETRON 4 MG PO TBDP
4.0000 mg | ORAL_TABLET | Freq: Three times a day (TID) | ORAL | 0 refills | Status: AC | PRN
Start: 2016-07-08 — End: ?

## 2016-07-08 MED ORDER — ONDANSETRON HCL 4 MG/2ML IJ SOLN
INTRAMUSCULAR | Status: AC
  Filled 2016-07-08: qty 2

## 2016-07-08 NOTE — ED Notes (Signed)
Pt verbalized understanding of discharge instructions. NAD at this time. 

## 2016-07-08 NOTE — ED Provider Notes (Signed)
[facility] Emergency Department Provider Note  Time seen: 9:42 AM  I have reviewed the triage vital signs and the nursing notes.   HISTORY  Chief Complaint Fall and Shoulder Injury    HPI Breanna Berg is a 41 y.o. female with a past medical history of depression who presents to the emergency department right shoulder pain. According to the patient she slipped falling down several steps landing on her right arm. Patient states immediate pain to the right shoulder and since she has not been able to move the shoulder due to pain. Denies any other injuries. Denies hitting her head or loss of consciousness. Describes her pain as moderate to severe located in the right shoulder.  Past Medical History:  Diagnosis Date  . Depression   . Hernia, umbilical   . Menorrhagia   . Seasonal allergies     Patient Active Problem List   Diagnosis Date Noted  . Umbilical hernia without obstruction and without gangrene   . Umbilical hernia without obstruction or gangrene 05/22/2015  . 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 HLD (hyperlipidemia) 11/27/2013  . Clinical depression 03/22/2013  . 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 High-risk pregnancy 03/22/2013    Past Surgical History:  Procedure Laterality Date  . DILATION AND CURETTAGE OF UTERUS  04/2010  . LAPAROSCOPIC BILATERAL SALPINGECTOMY Bilateral 06/13/2015   Procedure: LAPAROSCOPIC BILATERAL SALPINGECTOMY;  Surgeon: Elenora Fender Ward, MD;  Location: [facility];  Service: Gynecology;  Laterality: Bilateral;  . LAPAROSCOPIC TUBAL LIGATION N/A 06/13/2015   Procedure: LAPAROSCOPIC TUBAL LIGATION;  Surgeon: Elenora Fender Ward, MD;  Location: [facility];  Service: Gynecology;  Laterality: N/A;  . NOVASURE ABLATION N/A 06/13/2015   Procedure: Cammy Copa ABLATION;  Surgeon: Elenora Fender Ward, MD;  Location: [facility];  Service: Gynecology;  Laterality: N/A;  . UMBILICAL HERNIA REPAIR N/A 06/13/2015   Procedure: LAPAROSCOPIC UMBILICAL HERNIA;  Surgeon: [name], MD;  Location: [facility];  Service: General;  Laterality: N/A;    Prior to Admission medications   Medication Sig Start Date End Date Taking? Authorizing Provider  FLUoxetine (PROZAC) 20 MG capsule Take 1 capsule by mouth 1 day or 1 dose. 07/02/14   Historical Provider, MD  fluticasone (FLONASE) 50 MCG/ACT nasal spray Place 2 sprays into the nose 1 day or 1 dose. 12/13/14 12/13/15  Historical Provider, MD  ibuprofen (ADVIL,MOTRIN) 800 MG tablet Take 1 tablet (800 mg total) by mouth every 8 (eight) hours as needed. 05/15/15   Anne-Caroline Sharma Covert, MD  loratadine (CLARITIN) 10 MG tablet Take 1 tablet by mouth as needed.    Historical Provider, MD  montelukast (SINGULAIR) 10 MG tablet Take 1 tablet by mouth 1 day or 1 dose. 12/13/14 12/13/15  Historical Provider, MD  potassium chloride SA (K-DUR,KLOR-CON) 20 MEQ tablet Take 1 tablet (20 mEq total) by mouth 2 (two) times daily. 06/05/15   [name], MD    Allergies  Allergen Reactions  . Avelox [Moxifloxacin] Nausea And Vomiting  . Mesh Pants [Attends Briefs Large] Rash  . Wellbutrin [Bupropion] Rash    Family History  Problem Relation Age of Onset  . Diabetes Father   . Breast cancer Neg Hx     Social History Social History  Substance Use Topics  . Smoking status: Never Smoker  . Smokeless tobacco: Never Used  . Alcohol use 0.0 oz/week     Comment: ocass    Review of Systems Constitutional: Negative for fever Cardiovascular: Negative for  chest pain. Respiratory: Negative for shortness of breath. Gastrointestinal: Negative for abdominal pain Musculoskeletal: Positive for moderate to severe right shoulder pain. Neurological: Negative for headaches, focal weakness or numbness. 10-point ROS otherwise negative.  ____________________________________________   PHYSICAL EXAM:  VITAL SIGNS: ED Triage Vitals  Enc Vitals Group     BP 07/08/16 0841 103/69     Pulse  Rate 07/08/16 0841 (!) 106     Resp 07/08/16 0841 18     Temp 07/08/16 0841 97.7 F (36.5 C)     Temp Source 07/08/16 0841 Oral     SpO2 07/08/16 0841 98 %     Weight 07/08/16 0843 155 lb (70.3 kg)     Height 07/08/16 0843 5\' 2"  (1.575 m)     Head Circumference --      Peak Flow --      Pain Score 07/08/16 0844 10     Pain Loc --      Pain Edu? --      Excl. in GC? --     Constitutional: Alert and oriented. Well appearing and in no distress. Eyes: Normal exam ENT   Head: Normocephalic and atraumatic.   Mouth/Throat: Mucous membranes are moist. Cardiovascular: Normal rate, regular rhythm. No murmur Respiratory: Normal respiratory effort without tachypnea nor retractions. Breath sounds are clear Gastrointestinal: Soft and nontender. No distention.  Musculoskeletal: Moderate tenderness to right shoulder palpation with obvious deformity most consistent with dislocation. Neurovascular intact distally with 2+ radial pulse. Neurologic:  Normal speech and language. No gross focal neurologic deficits  Skin:  Skin is warm, dry and intact.  Psychiatric: Mood and affect are normal.   ____________________________________________   RADIOLOGY  X-ray consistent with anterior dislocation  ____________________________________________   INITIAL IMPRESSION / ASSESSMENT AND PLAN / ED COURSE  Pertinent labs & imaging results that were available during my care of the patient were reviewed by me and considered in my medical decision making (see chart for details).  X-ray consistent with anterior dislocation. We will dose pain medication and attempt reduction. If the patient is unable to tolerate will likely perform moderate sedation for reduction.  Shoulder reduced with Stimpson technique. We'll repeat x-ray imaging.  Reduction of dislocation Date/Time: 9:58 AM Performed by: Minna AntisPADUCHOWSKI, Lailee Hoelzel Authorized by: Minna AntisPADUCHOWSKI, Preston Weill Consent: Verbal consent obtained. Risks and benefits:  risks, benefits and alternatives were discussed Consent given by: patient Required items: required blood products, implants, devices, and special equipment available Time out: Immediately prior to procedure a "time out" was called to verify the correct patient, procedure, equipment, support staff and site/side marked as required.  Patient sedated: No, 1md dilaudid for pain control  Vitals: Vital signs were monitored during sedation. Patient tolerance: Patient tolerated the procedure well with no immediate complications. Joint: Right shoulder Reduction technique: Stimpson   Repeat x-ray shows successful reduction. We'll placed in a right shoulder sling and have the patient follow-up with orthopedics we will discharge her pain medication to be taken as needed.    ____________________________________________   FINAL CLINICAL IMPRESSION(S) / ED DIAGNOSES  Right anterior shoulder dislocation.    Minna AntisKevin Klayten Jolliff, MD 07/08/16 1047

## 2016-07-08 NOTE — ED Notes (Signed)
FIRST NURSE: tripped down steps , right shoulder pain , deformity noted, arm in a sling on arrival

## 2016-07-08 NOTE — ED Triage Notes (Signed)
Larey SeatFell down steps this am and landed on right shoulder after slipping on a sleeping bag.  Deformity to shoulder.  Says she has had nothing to eat or drink today.

## 2016-07-08 NOTE — ED Notes (Signed)
Patient transported to X-ray 

## 2016-07-08 NOTE — Discharge Instructions (Signed)
Please call the number provided for orthopedics to arrange a follow-up appointment in the next 1-2 weeks. Please wear your sling. You may take this off to shower/change clothes etc, but do not elevate your arm overhead. Please take your pain medication as needed, as prescribed. He may also use ibuprofen as needed for discomfort. Return to the emergency department for any acute worsening of pain, or any other symptom personally concerning to yourself.

## 2017-03-23 ENCOUNTER — Other Ambulatory Visit: Payer: Self-pay | Admitting: Obstetrics & Gynecology

## 2017-03-23 DIAGNOSIS — Z1231 Encounter for screening mammogram for malignant neoplasm of breast: Secondary | ICD-10-CM

## 2017-04-29 ENCOUNTER — Ambulatory Visit
Admission: RE | Admit: 2017-04-29 | Discharge: 2017-04-29 | Disposition: A | Discharge status: Home / Self Care | Payer: Managed Care, Other (non HMO) | Source: Ambulatory Visit | Attending: Obstetrics & Gynecology | Admitting: Obstetrics & Gynecology

## 2017-04-29 DIAGNOSIS — Z1231 Encounter for screening mammogram for malignant neoplasm of breast: Secondary | ICD-10-CM | POA: Diagnosis present

## 2018-05-16 ENCOUNTER — Other Ambulatory Visit: Payer: Self-pay | Admitting: Obstetrics & Gynecology

## 2018-05-16 DIAGNOSIS — Z1231 Encounter for screening mammogram for malignant neoplasm of breast: Secondary | ICD-10-CM

## 2018-06-03 ENCOUNTER — Ambulatory Visit
Admission: RE | Admit: 2018-06-03 | Discharge: 2018-06-03 | Disposition: A | Discharge status: Home / Self Care | Payer: BLUE CROSS/BLUE SHIELD | Source: Ambulatory Visit | Attending: Obstetrics & Gynecology | Admitting: Obstetrics & Gynecology

## 2018-06-03 DIAGNOSIS — Z1231 Encounter for screening mammogram for malignant neoplasm of breast: Secondary | ICD-10-CM

## 2019-05-09 ENCOUNTER — Other Ambulatory Visit: Payer: Self-pay | Admitting: Obstetrics & Gynecology

## 2019-06-13 ENCOUNTER — Other Ambulatory Visit: Payer: Self-pay | Admitting: Obstetrics & Gynecology

## 2019-06-13 DIAGNOSIS — R2232 Localized swelling, mass and lump, left upper limb: Secondary | ICD-10-CM

## 2019-06-26 ENCOUNTER — Other Ambulatory Visit: Payer: Managed Care, Other (non HMO)

## 2019-06-29 ENCOUNTER — Ambulatory Visit
Admission: RE | Admit: 2019-06-29 | Discharge: 2019-06-29 | Disposition: A | Discharge status: Home / Self Care | Payer: BC Managed Care – PPO | Source: Ambulatory Visit | Attending: Obstetrics & Gynecology | Admitting: Obstetrics & Gynecology

## 2019-06-29 DIAGNOSIS — R2232 Localized swelling, mass and lump, left upper limb: Secondary | ICD-10-CM | POA: Diagnosis present

## 2019-06-30 ENCOUNTER — Ambulatory Visit: Payer: Managed Care, Other (non HMO) | Attending: Internal Medicine

## 2019-06-30 DIAGNOSIS — Z23 Encounter for immunization: Secondary | ICD-10-CM

## 2019-06-30 NOTE — Progress Notes (Signed)
   Covid-19 Vaccination Clinic  Name:  Breanna Berg    MRN: [id number] DOB: April 22, 1975  06/30/2019  Breanna Berg was observed post Covid-19 immunization for 15 minutes without incident. She was provided with Vaccine Information Sheet and instruction to access the V-Safe system.   Breanna Berg was instructed to call 911 with any severe reactions post vaccine: Marland Kitchen Difficulty breathing  . Swelling of face and throat  . A fast heartbeat  . A bad rash all over body  . Dizziness and weakness   Immunizations Administered    Name Date Dose VIS Date Route   Pfizer COVID-19 Vaccine 06/30/2019  5:18 PM 0.3 mL 03/24/2019 Intramuscular   Manufacturer: [employer], [employer]   Lot: [id number]   NDC: [address]-6

## 2019-07-03 ENCOUNTER — Other Ambulatory Visit: Payer: Self-pay | Admitting: Obstetrics & Gynecology

## 2019-07-03 DIAGNOSIS — N6320 Unspecified lump in the left breast, unspecified quadrant: Secondary | ICD-10-CM

## 2019-07-03 DIAGNOSIS — R928 Other abnormal and inconclusive findings on diagnostic imaging of breast: Secondary | ICD-10-CM

## 2019-07-05 ENCOUNTER — Ambulatory Visit
Admission: RE | Admit: 2019-07-05 | Discharge: 2019-07-05 | Disposition: A | Discharge status: Home / Self Care | Payer: BC Managed Care – PPO | Source: Ambulatory Visit | Attending: Obstetrics & Gynecology | Admitting: Obstetrics & Gynecology

## 2019-07-05 DIAGNOSIS — R928 Other abnormal and inconclusive findings on diagnostic imaging of breast: Secondary | ICD-10-CM

## 2019-07-05 DIAGNOSIS — N6320 Unspecified lump in the left breast, unspecified quadrant: Secondary | ICD-10-CM

## 2019-07-05 HISTORY — PX: BREAST BIOPSY: SHX20

## 2019-07-06 LAB — SURGICAL PATHOLOGY

## 2019-07-28 ENCOUNTER — Ambulatory Visit: Payer: Managed Care, Other (non HMO) | Attending: Internal Medicine

## 2019-07-28 DIAGNOSIS — Z23 Encounter for immunization: Secondary | ICD-10-CM

## 2019-07-28 NOTE — Progress Notes (Signed)
   Covid-19 Vaccination Clinic  Name:  Breanna Berg    MRN: [id number] DOB: [birth year]  07/28/2019  [name] was observed post Covid-19 immunization for 15 minutes without incident. She was provided with Vaccine Information Sheet and instruction to access the V-Safe system.   [name] was instructed to call 911 with any severe reactions post vaccine: Marland Kitchen Difficulty breathing  . Swelling of face and throat  . A fast heartbeat  . A bad rash all over body  . Dizziness and weakness   Immunizations Administered    Name Date Dose VIS Date Route   Pfizer COVID-19 Vaccine 07/28/2019  5:17 PM 0.3 mL 03/24/2019 Intramuscular   Manufacturer: [employer], [employer]   Lot: [id number]   NDC: [address]-9

## 2021-09-12 IMAGING — MG MM BREAST LOCALIZATION CLIP
4 series · 4 of 12 positions shown · non-contrast
Comparison: Previous exam(s).

CLINICAL DATA: Patient status post ultrasound-guided core needle
biopsy left breast mass.

EXAM:
DIAGNOSTIC LEFT MAMMOGRAM POST ULTRASOUND BIOPSY

[L CC synth-2D]
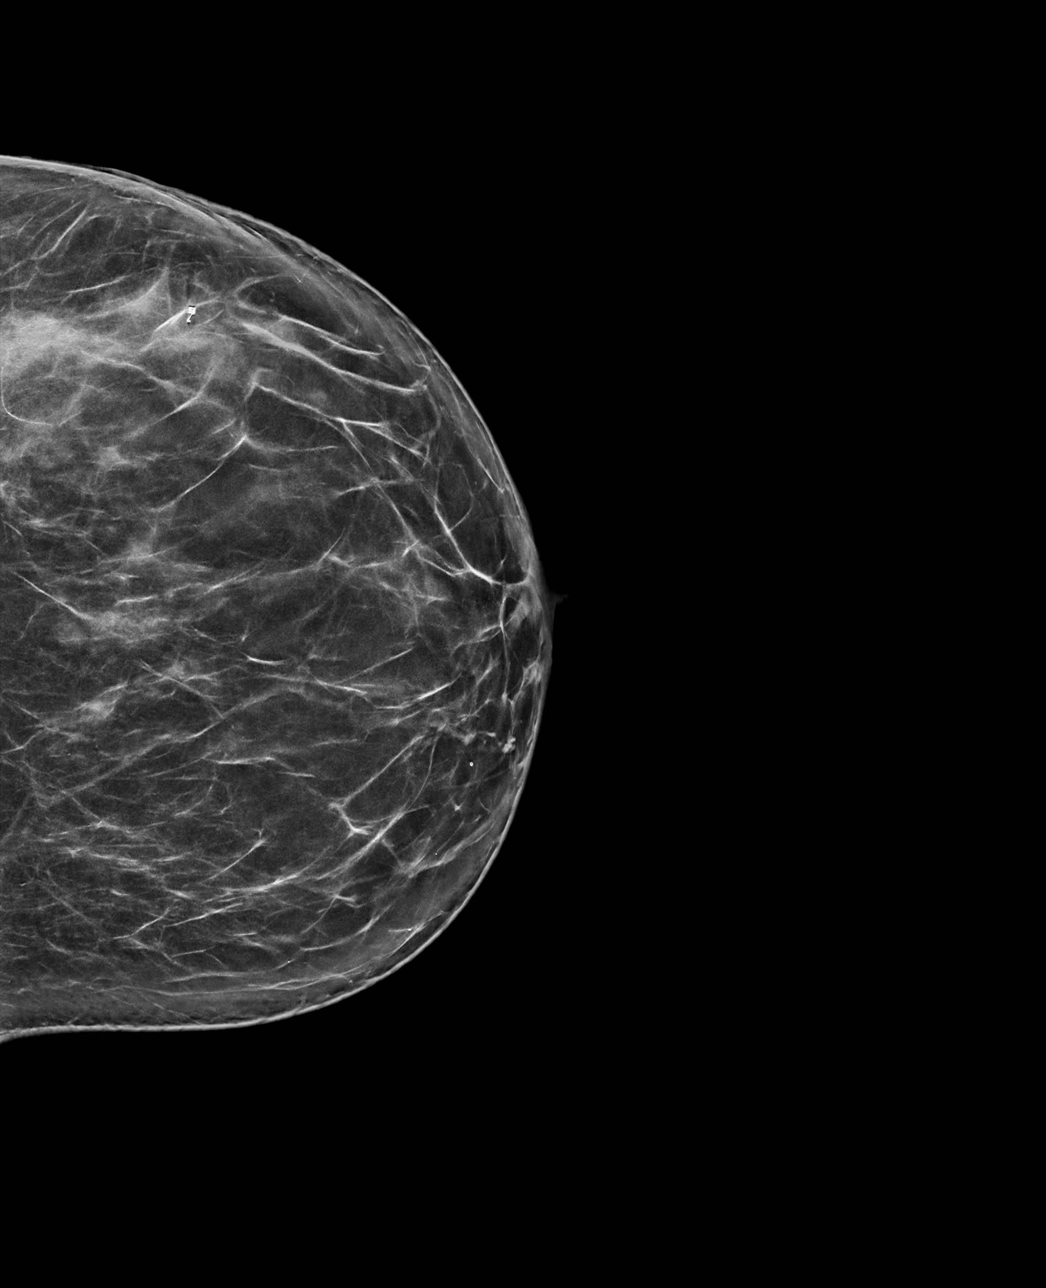

[L ML synth-2D]
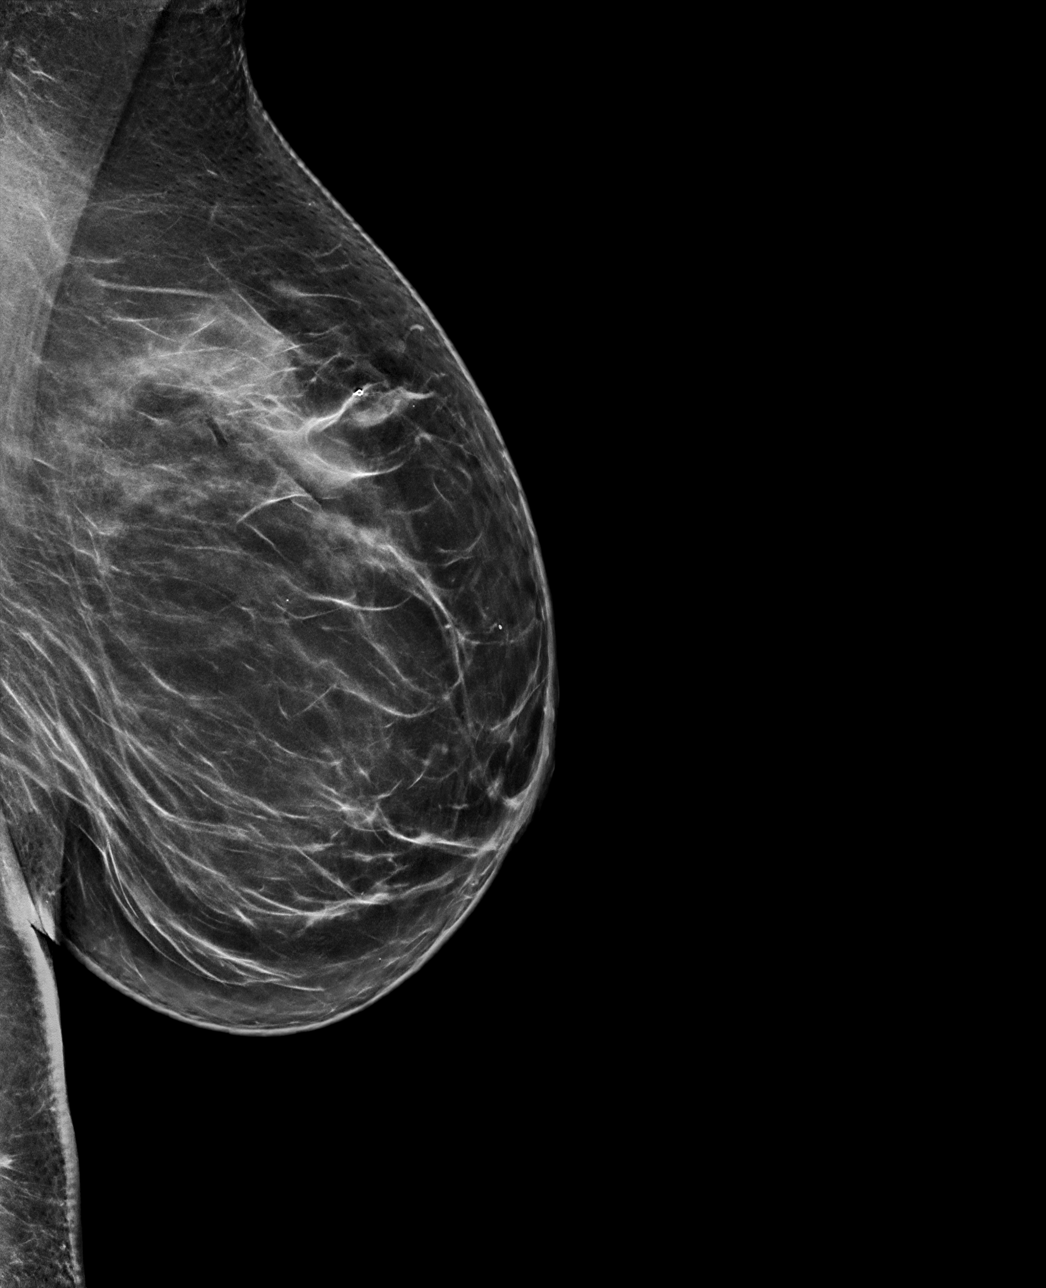

[L ML tomo · tomo slice 39/78.0]
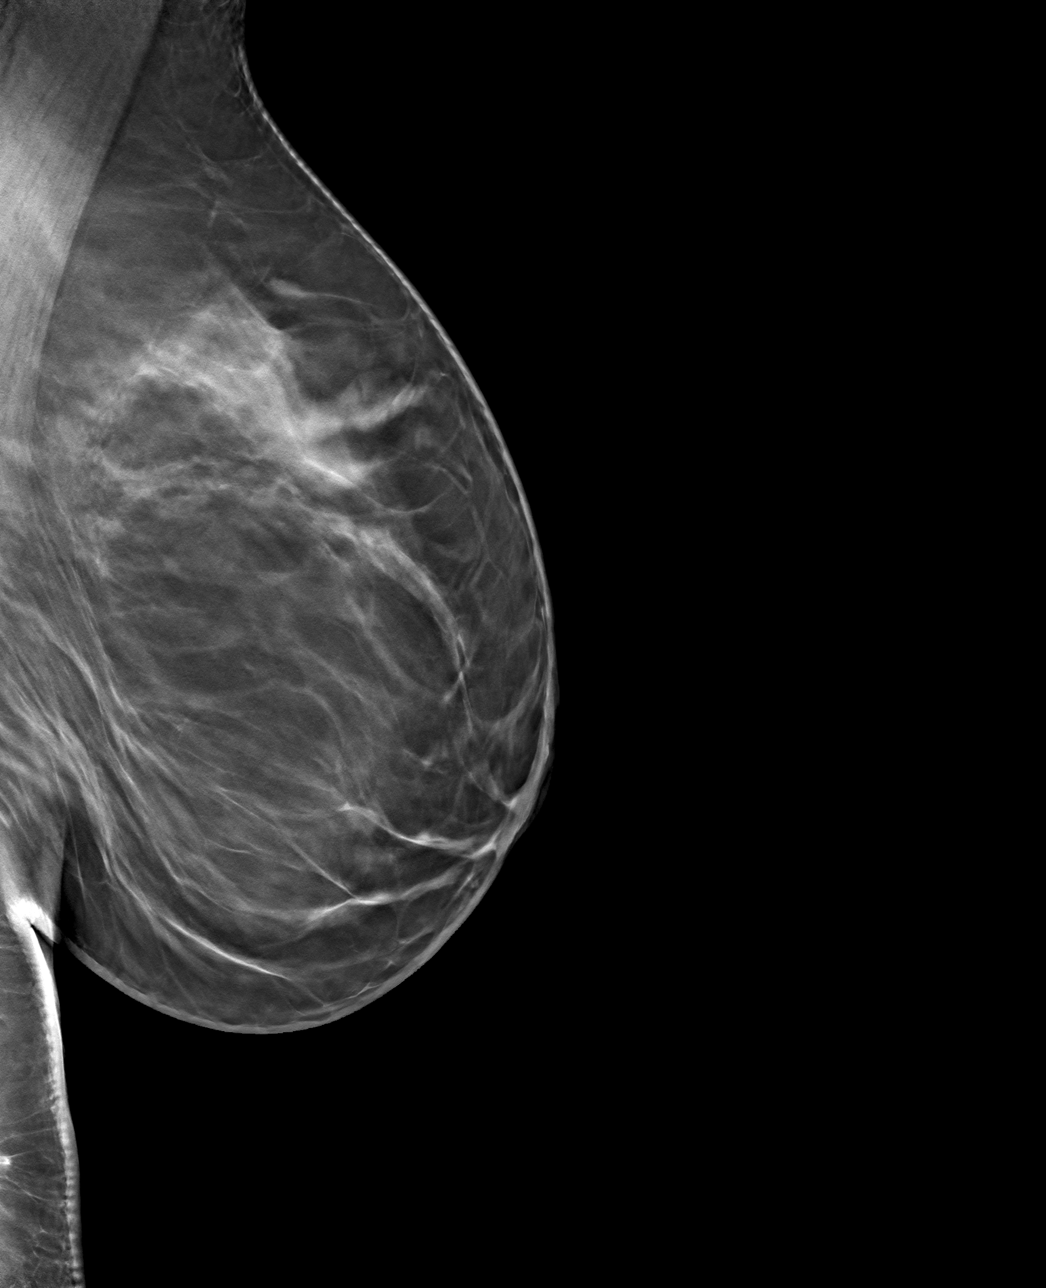

[L CC tomo · tomo slice 37/74.0]
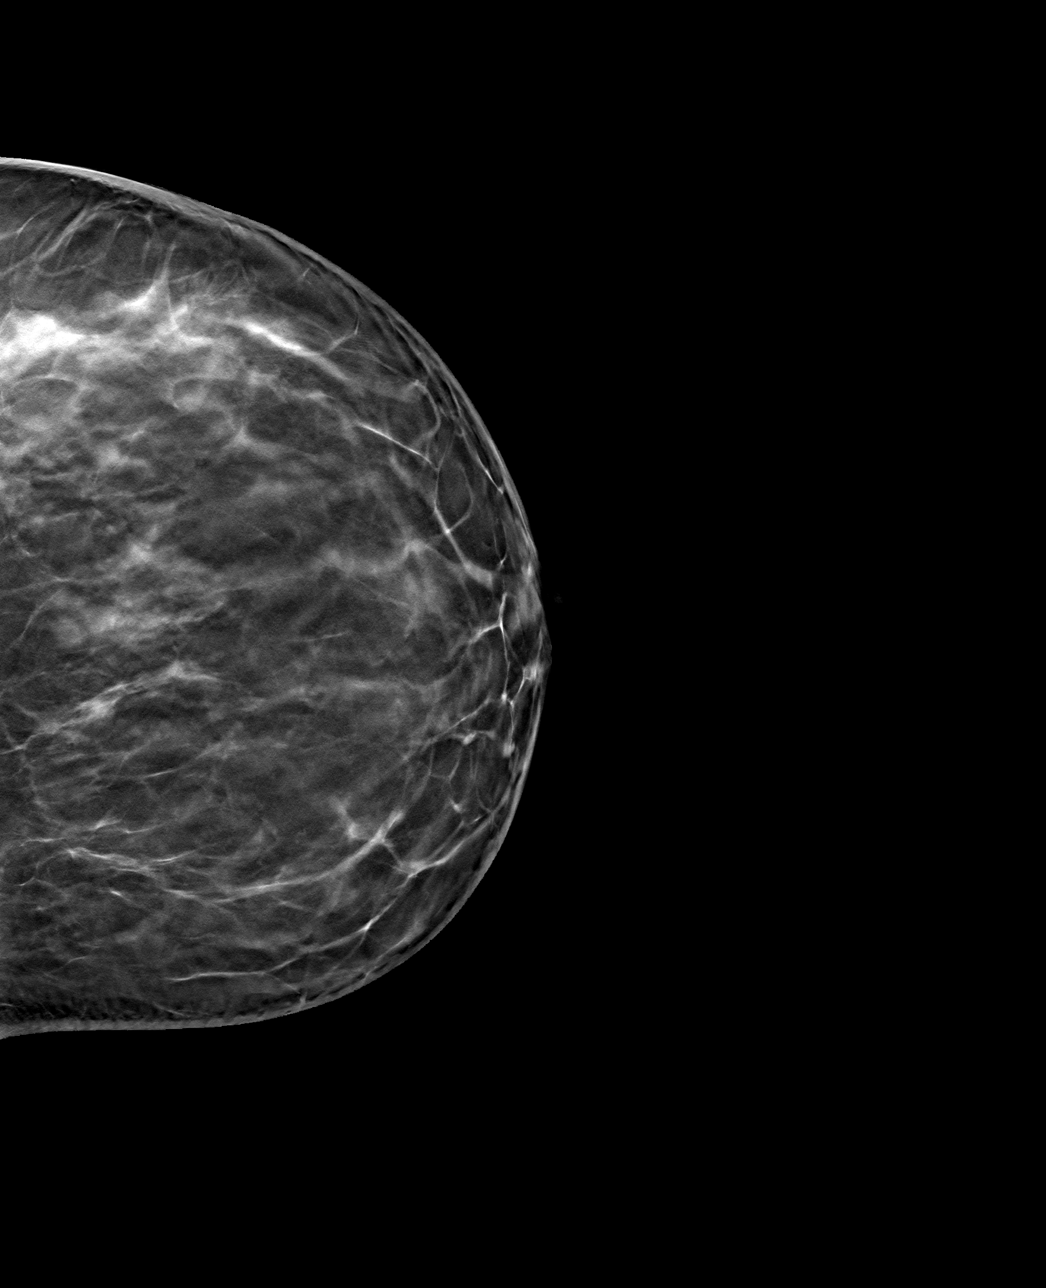

[4 of 12 positions shown; findings below may reference images not displayed]

FINDINGS: Mammographic images were obtained following ultrasound guided biopsy
of left breast mass 1 o'clock position. The biopsy marking clip is
in expected position at the site of biopsy.
IMPRESSION: Appropriate positioning of the coil shaped biopsy marking clip at
the site of biopsy in the left breast mass 1 o'clock position.

Final Assessment: Post Procedure Mammograms for Marker Placement
